# Patient Record
Sex: Male | Born: 1955
Health system: Southern US, Community
[De-identification: ages and names within clinical notes are randomized; demographics above are authoritative.]

## PROBLEM LIST (undated history)

## (undated) DIAGNOSIS — G8929 Other chronic pain: Secondary | ICD-10-CM

## (undated) DIAGNOSIS — J449 Chronic obstructive pulmonary disease, unspecified: Secondary | ICD-10-CM

## (undated) DIAGNOSIS — D72829 Elevated white blood cell count, unspecified: Secondary | ICD-10-CM

## (undated) DIAGNOSIS — I10 Essential (primary) hypertension: Secondary | ICD-10-CM

## (undated) HISTORY — PX: OTHER SURGICAL HISTORY: SHX169

## (undated) HISTORY — DX: Elevated white blood cell count, unspecified: D72.829

## (undated) HISTORY — DX: Chronic obstructive pulmonary disease, unspecified: J44.9

## (undated) HISTORY — DX: Essential (primary) hypertension: I10

## (undated) HISTORY — DX: Other chronic pain: G89.29

---

## 2000-10-30 ENCOUNTER — Encounter: Payer: Self-pay | Admitting: Emergency Medicine

## 2000-10-30 ENCOUNTER — Inpatient Hospital Stay (HOSPITAL_COMMUNITY): Admission: EM | Admit: 2000-10-30 | Discharge: 2000-11-10 | Payer: Self-pay | Admitting: Emergency Medicine

## 2000-11-04 ENCOUNTER — Encounter: Payer: Self-pay | Admitting: Internal Medicine

## 2000-11-05 ENCOUNTER — Encounter: Payer: Self-pay | Admitting: Family Medicine

## 2000-11-08 ENCOUNTER — Encounter: Payer: Self-pay | Admitting: General Surgery

## 2000-12-01 ENCOUNTER — Inpatient Hospital Stay (HOSPITAL_COMMUNITY): Admission: EM | Admit: 2000-12-01 | Discharge: 2000-12-04 | Payer: Self-pay | Admitting: Emergency Medicine

## 2000-12-01 ENCOUNTER — Encounter: Payer: Self-pay | Admitting: Emergency Medicine

## 2000-12-02 ENCOUNTER — Encounter: Payer: Self-pay | Admitting: Family Medicine

## 2001-01-09 ENCOUNTER — Ambulatory Visit (HOSPITAL_COMMUNITY): Admission: RE | Admit: 2001-01-09 | Discharge: 2001-01-09 | Payer: Self-pay | Admitting: General Surgery

## 2001-01-09 ENCOUNTER — Encounter: Payer: Self-pay | Admitting: General Surgery

## 2001-01-28 ENCOUNTER — Inpatient Hospital Stay (HOSPITAL_COMMUNITY): Admission: AD | Admit: 2001-01-28 | Discharge: 2001-02-04 | Payer: Self-pay | Admitting: General Surgery

## 2005-01-26 ENCOUNTER — Ambulatory Visit (HOSPITAL_COMMUNITY): Admission: RE | Admit: 2005-01-26 | Discharge: 2005-01-26 | Payer: Self-pay | Admitting: *Deleted

## 2005-08-27 ENCOUNTER — Ambulatory Visit (HOSPITAL_COMMUNITY): Admission: RE | Admit: 2005-08-27 | Discharge: 2005-08-27 | Payer: Self-pay | Admitting: Family Medicine

## 2005-10-15 ENCOUNTER — Ambulatory Visit (HOSPITAL_COMMUNITY): Admission: RE | Admit: 2005-10-15 | Discharge: 2005-10-15 | Payer: Self-pay | Admitting: Family Medicine

## 2006-04-01 ENCOUNTER — Inpatient Hospital Stay (HOSPITAL_COMMUNITY): Admission: EM | Admit: 2006-04-01 | Discharge: 2006-04-03 | Payer: Self-pay | Admitting: Emergency Medicine

## 2006-04-18 ENCOUNTER — Inpatient Hospital Stay (HOSPITAL_COMMUNITY): Admission: RE | Admit: 2006-04-18 | Discharge: 2006-04-20 | Payer: Self-pay | Admitting: Orthopedic Surgery

## 2007-08-15 ENCOUNTER — Ambulatory Visit: Admission: RE | Admit: 2007-08-15 | Discharge: 2007-08-15 | Payer: Self-pay | Admitting: Orthopedic Surgery

## 2007-08-15 ENCOUNTER — Encounter (INDEPENDENT_AMBULATORY_CARE_PROVIDER_SITE_OTHER): Payer: Self-pay | Admitting: Orthopedic Surgery

## 2008-07-14 IMAGING — CR DG ANKLE 2V *R*
2 series · 2 of 2 positions shown · non-contrast
Comparison: none

CLINICAL DATA: Tree fell on ankle; pain.
 RIGHT ANKLE ? 2 VIEWS ? 04/01/06:

[view not recorded (1 of 2)]
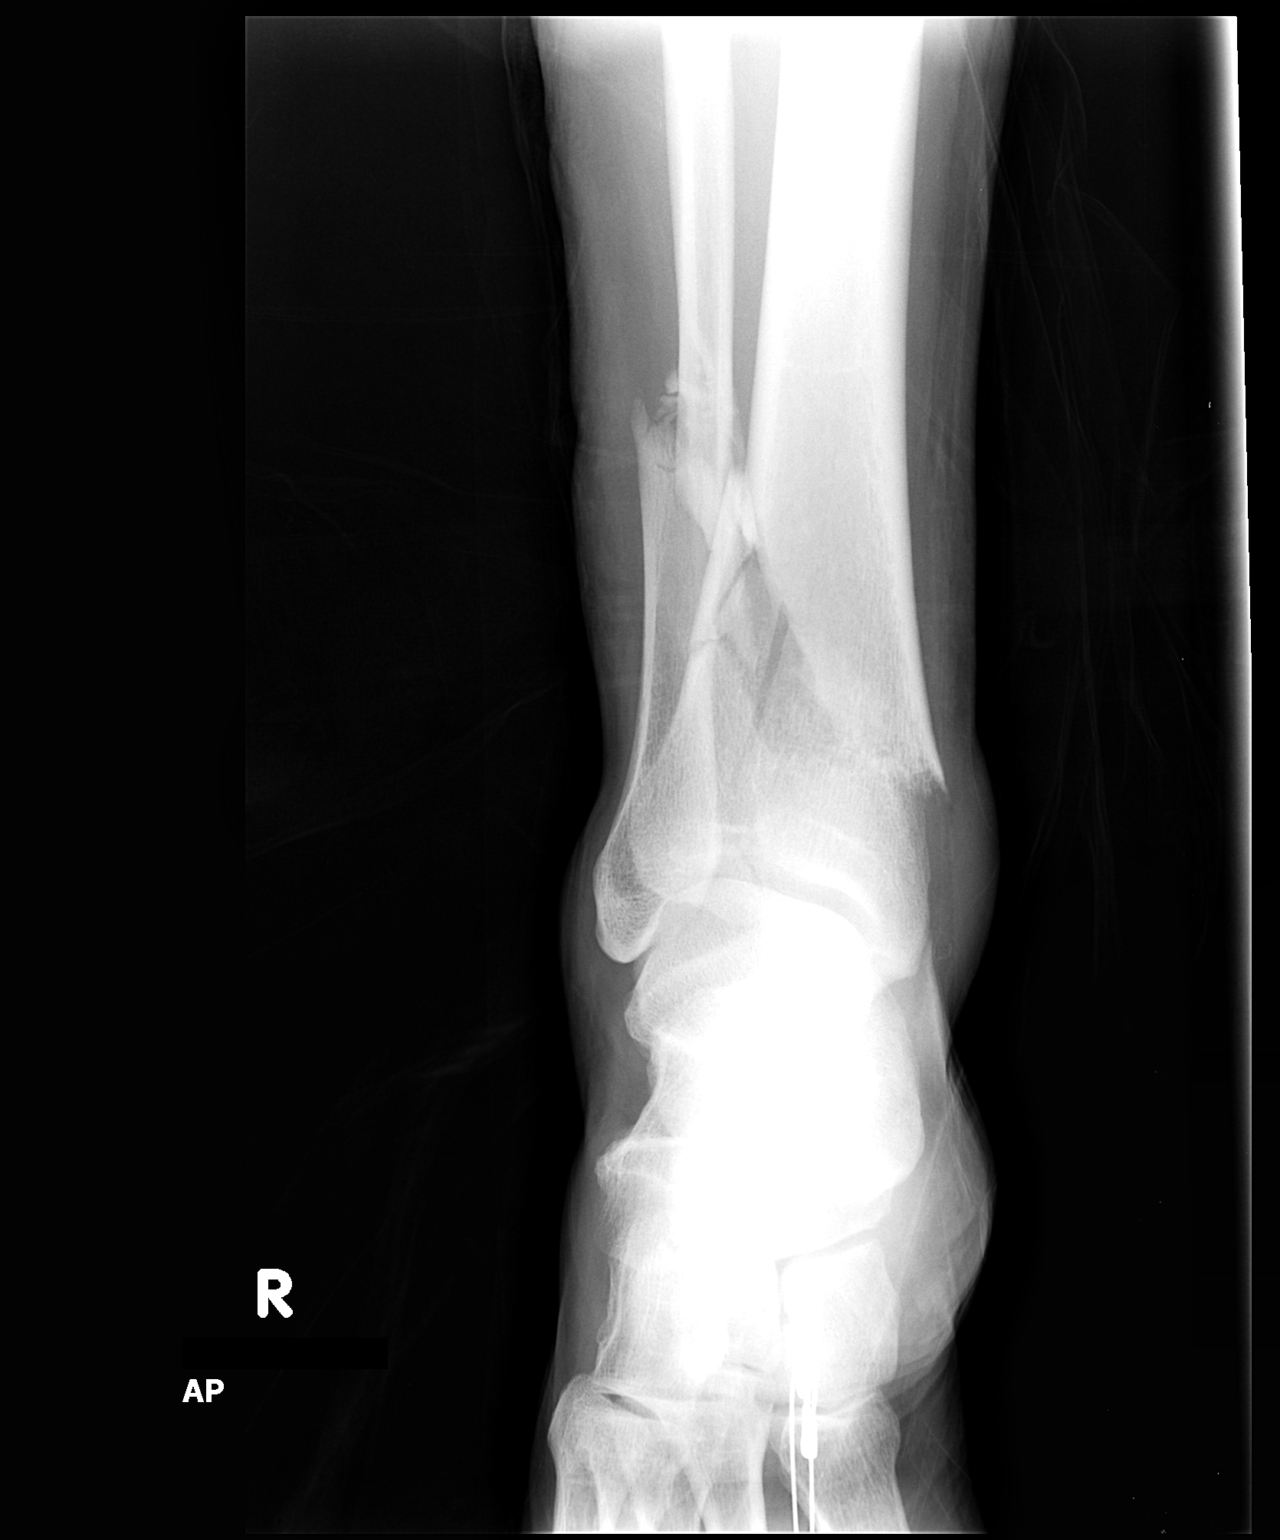

[view not recorded (2 of 2)]
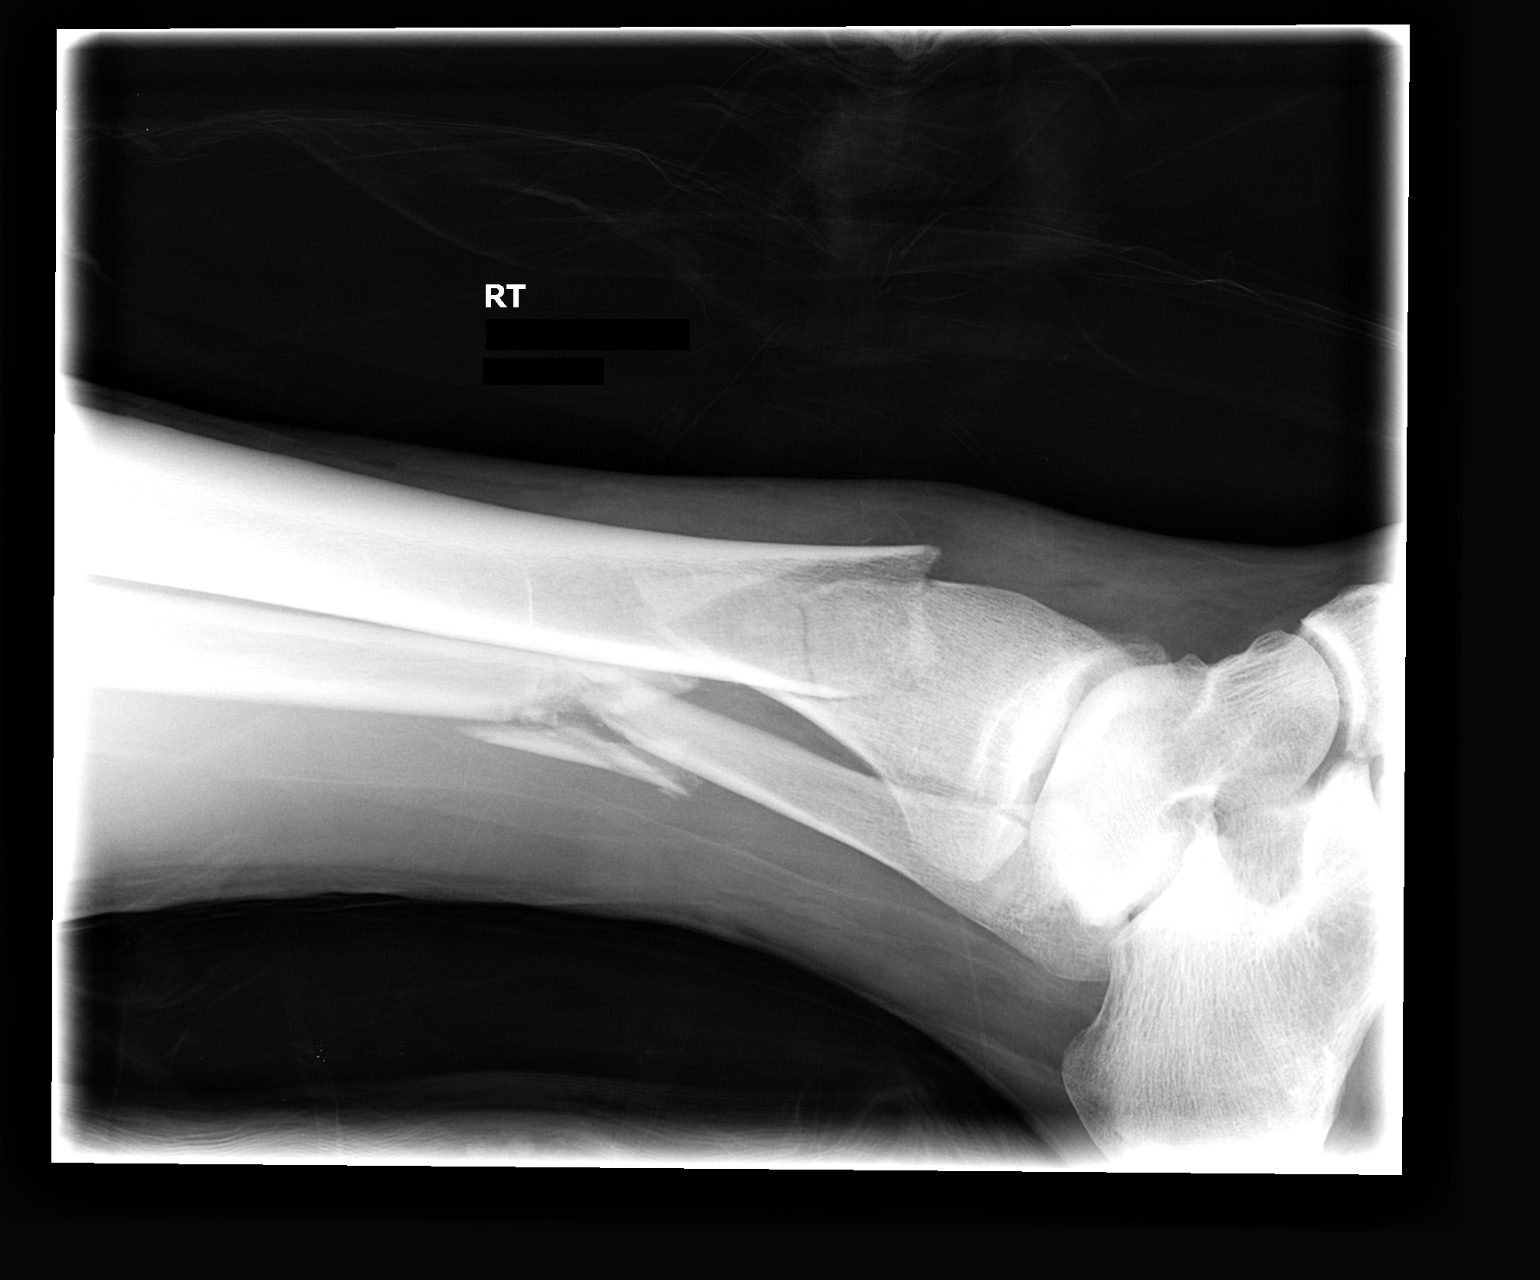

[2 of 2 positions shown; findings below may reference images not displayed]

FINDINGS: There are comminuted fractures of the distal tibia and fibula. The tibial fracture extends into the posterior ankle joint. The fibular fracture involves the shaft only.  The ankle mortise remains anatomically aligned. There is apex anterior angulation at both major fracture sites.
IMPRESSION: Comminuted fractures of the distal right fibular shaft and the distal right tibia which extends into the ankle joint. The ankle mortise remains anatomically aligned.

## 2008-07-29 ENCOUNTER — Ambulatory Visit (HOSPITAL_COMMUNITY): Admission: RE | Admit: 2008-07-29 | Discharge: 2008-07-29 | Payer: Self-pay | Admitting: Orthopedic Surgery

## 2008-09-03 ENCOUNTER — Ambulatory Visit (HOSPITAL_COMMUNITY): Admission: RE | Admit: 2008-09-03 | Discharge: 2008-09-05 | Payer: Self-pay | Admitting: Orthopedic Surgery

## 2008-11-25 ENCOUNTER — Encounter: Admission: RE | Admit: 2008-11-25 | Discharge: 2008-11-25 | Payer: Self-pay | Admitting: Orthopedic Surgery

## 2009-02-11 ENCOUNTER — Encounter: Admission: RE | Admit: 2009-02-11 | Discharge: 2009-03-22 | Payer: Self-pay | Admitting: Anesthesiology

## 2009-03-22 ENCOUNTER — Ambulatory Visit: Payer: Self-pay | Admitting: Anesthesiology

## 2009-07-21 ENCOUNTER — Ambulatory Visit (HOSPITAL_COMMUNITY): Admission: RE | Admit: 2009-07-21 | Discharge: 2009-07-21 | Payer: Self-pay | Admitting: Family Medicine

## 2009-08-29 ENCOUNTER — Encounter: Admission: RE | Admit: 2009-08-29 | Discharge: 2009-08-29 | Payer: Self-pay | Admitting: Anesthesiology

## 2009-10-05 ENCOUNTER — Ambulatory Visit (HOSPITAL_COMMUNITY): Admission: RE | Admit: 2009-10-05 | Discharge: 2009-10-05 | Payer: Self-pay | Admitting: Anesthesiology

## 2010-04-15 LAB — GRAM STAIN

## 2010-04-15 LAB — COMPREHENSIVE METABOLIC PANEL
BUN: 14 mg/dL (ref 6–23)
CO2: 32 mEq/L (ref 19–32)
Calcium: 8.9 mg/dL (ref 8.4–10.5)
Creatinine, Ser: 1.11 mg/dL (ref 0.4–1.5)
Potassium: 3.8 mEq/L (ref 3.5–5.1)
Sodium: 139 mEq/L (ref 135–145)
Total Protein: 6.1 g/dL (ref 6.0–8.3)

## 2010-04-15 LAB — DIFFERENTIAL
Basophils Absolute: 0 10*3/uL (ref 0.0–0.1)
Basophils Relative: 1 % (ref 0–1)
Eosinophils Relative: 3 % (ref 0–5)
Lymphocytes Relative: 28 % (ref 12–46)

## 2010-04-15 LAB — TISSUE CULTURE
Culture: NO GROWTH
Gram Stain: NONE SEEN

## 2010-04-15 LAB — CBC: Platelets: 231 10*3/uL (ref 150–400)

## 2010-04-15 LAB — C-REACTIVE PROTEIN: CRP: 0.4 mg/dL — ABNORMAL LOW (ref <0.6)

## 2010-04-15 LAB — ANAEROBIC CULTURE

## 2010-04-15 LAB — APTT: aPTT: 33 seconds (ref 24–37)

## 2010-04-15 LAB — SEDIMENTATION RATE: Sed Rate: 6 mm/hr (ref 0–16)

## 2010-04-15 LAB — WOUND CULTURE

## 2010-05-23 NOTE — Op Note (Signed)
NAME:  Brian Rich, Brian Rich              ACCOUNT NO.:  [id number]   MEDICAL RECORD NO.:  [id number]          PATIENT TYPE:  INP   LOCATION:  2550                         FACILITY:  [facility]   PHYSICIAN:  Doralee Albino. Carola Frost, M.D. DATE OF BIRTH:  March 10, 1955   DATE OF PROCEDURE:  08/15/2007  DATE OF DISCHARGE:  08/15/2007                               OPERATIVE REPORT   PREOPERATIVE DIAGNOSES:  1. Right ankle tibial nonunion.  2. Symptomatic hardware, right fibula.   POSTOPERATIVE DIAGNOSES:  1. Right ankle tibial nonunion.  2. Symptomatic hardware, right fibula.   PROCEDURES:  1. Repair of right tibia nonunion using a Vitoss structural allograft.  2. Removal of symptomatic hardware, right fibula.   SURGEON:  Doralee Albino. Carola Frost, MD   ASSISTANT:  Mearl Latin, PA   ANESTHESIA:  General supplemented with regional block.   COMPLICATIONS:  None.   SPECIMENS:  Three anaerobic, aerobic cultures and soft tissue fibrous  specimen which was greater than 3 x 3 cm within the distal metaphysis.   DRAINS:  None.   DISPOSITION:  PACU.   CONDITION:  Stable.   BRIEF SUMMARY AND INDICATION FOR PROCEDURE:  Brian Rich is a 55-year-  old male status post comminuted right distal tibia and fibula fracture.  The patient went on to return to work but he continued to complain of  aching distal tibia pain.  We obtained an MRI which clearly demonstrated  a sizable area of nonunion involving the anterolateral metaphysis  extending to the posterior cortex.  We discussed the risk and benefits  of treatment of this, as well as removal of the implant of the fibula  where he continued to have hypersensitivity and symptoms.  After full  discussion of those risks and benefits, he wished to proceed as did his  wife.   BRIEF DESCRIPTION OF PROCEDURE:  Brian Rich was administered preop  antibiotics, taken to the operating room where general anesthesia was  induced.  Right lower extremity was prepped and draped  in the usual  sterile fashion.  Tourniquet was put about the thigh but never inflated  during the procedure.  We made the old lateral incision, continued  through the surgical scar looking for the superficial peroneal nerve  which was not identified.  A fine plate elevating soft tissues carefully  and removing the screws and plate.  A rongeur was used to remove the  bony overgrowth debriding back to a smooth surface.  This was copiously  irrigated.   A 4-cm incision was then made distally.  Dissection was carried and the  periosteum layer was left intact initially.  We probed for the fracture  site with a 15 blade, identified it and then confirmed this  radiographically.  At that point, we split the periosteum over the  nonunion site.  We were surprised to find that it was an opening of only  8 x 4 mm.  We were concerned from the MRI that there were additional  openings laterally and consequently I did elevate the periosteum to look  both proximal and distal to this opening in the bone.  No other nonunion  extending to the lateral cortex or anterior cortex was identified.  We  then enlarged this opening slightly to approximately 10 x 10 mm and then  used a series of curettes to probe and debride the fibrous tissue within  the medullary canal.  It was a sizable cavity over 10 mL and did also  communicate with the intramedullary canal proximally as well.  We then  irrigated and as we did so, we did identify some fibrous debris  centrally.  We grabbed the tail of this and pulled it out delivering  greater than a 3 x 3 cm fibrous tissue mass.  We sent this down to  pathology for both culture and gross examination.  Similarly, we took  additional cultures with swabs for anaerobic and aerobic evaluation.  We  irrigated this area thoroughly and there was no purulence and no  evidence of infection at any time.  We then packed the defect with 10 mL  of Vitoss structural allograft focusing the  packing on the distal  metaphysis all the way back up to and including the opening in the  cortex.  We once more irrigated and closed in standard layered fashion  with 2-0 Vicryl and 3-0 nylon.  Simultaneous closure was performed of  the fibula with 2-0 Vicryl and 3-0 nylon greatly reducing his operative  time.  [name], PA assisted throughout the procedure with retraction  and again simultaneous wound closure.  At the conclusion of the  procedure, a sterile gently compressive dressing was applied then the  patient's off the shelf splint which he had from before.  He was  awakened from anesthesia and transferred to the PACU in stable  condition.   [name], PA, assisted throughout the procedure with retraction to  protect the neurovascular bundle and soft tissues.  He also helped to  close the wounds simultaneously, reducing operative time.   PROGNOSIS:  Brian Rich should do well following repair of this tibial  nonunion.  We are hopeful that his own bone will incorporate into the  structural allograft which would certainly be anticipated.  We will  follow up on his cultures but at this time, I have a very low suspicion  for infection.  He will be weightbearing up to tolerance as he should  have good structural integrity and will not require any formal DVT  prophylaxis.  Will continue to avoid NSAIDs and tobacco products and  will use his bone stimulator as an adjuvant for healing.      Doralee Albino. Carola Frost, M.D.  Electronically Signed     MHH/MEDQ  D:  08/15/2007  T:  08/15/2007  Job:  [id number]

## 2010-05-23 NOTE — Op Note (Signed)
NAME:  Brian, Rich              ACCOUNT NO.:  [id number]   MEDICAL RECORD NO.:  [id number]          PATIENT TYPE:  OIB   LOCATION:  5021                         FACILITY:  [facility]   PHYSICIAN:  Doralee Albino. Carola Frost, M.D. 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   POSTOPERATIVE DIAGNOSIS:  Right tibia cavitary nonunion/possible  infection.   PROCEDURES:  1. Intramedullary nailing of the right tibia.  2. Open treatment of tibial nonunion with grafting with autografting.  3. Reamed intramedullary aspiration of the right femur.   SURGEON:  Doralee Albino. Carola Frost, M.D.   ASSISTANT:  Mearl Latin, [place]   ANESTHESIA:  General.   COMPLICATIONS:  None.   ESTIMATED BLOOD LOSS:  250 mL of reaming.   DISPOSITION:  To PACU.   CONDITION:  Stable.   SPECIMENS:  1. Four anaerobic, aerobic cultures of intramedullary cyst nonunion      site to micro and path.  2. Anaerobic, aerobic cultures of old allograft right tibia      intramedullary cavity.   FINDINGS:  Negative for bacteria or significant PMN infiltrate.   BRIEF SUMMARY AND INDICATIONS FOR PROCEDURE:  Brian Rich is a very  pleasant 55 year old male who has been followed for several years for  difficulty recovering from a right distal tibia and fibular fracture.  MRI demonstrated recurrent large cavitary nonunion well over 50% of the  bone diameter.  The patient had clinical symptoms associated with  impending fracture.  We discussed the risks and benefits of surgery  including the possibility of infection, need for further surgery,  failure to obtain union, persistent infection, need for further surgery,  symptomatic hardware, and multiple others.  After full discussion, he  wished to proceed.   BRIEF SUMMARY OF PROCEDURE:  Brian Rich was administered preop  antibiotics.  Given the negative inflammatory markers and negative  clinical evidence of infection, as well as the sequestered material  within the canal which if infected we felt like would yield a positive  culture pathologic result.  We positioned him supine and again general  anesthesia was induced.  His right lower extremity was prepped and  draped usual sterile fashion.  I began with harvest of the reamed  intramedullary aspirate, making a 2-cm incision proximal of the greater  trochanter after identifying the correct starting point and trajectory  with C-arm.  The guidewire, then cannulated awl, then guidepin were  advanced in the distal femur and a 14-mm reamer was advanced collecting  large amount of graft.  This being successful, we then irrigated the  incision and closed in standard layered fashion with 0 Vicryl, 2-0  Vicryl, and 3-0 nylon.  Sterile gently compressive dressing was applied.   Attention was turned to the right tibia where an open incision was made  down at the nonunion or possible infection site.  This was carried down  into the periosteum.  Periosteum being split longitudinally with a 15  blade.  The  intramedullary contents then began to extrude under a slight  amount of pressure.  There were multiple fat globules and appeared to be  marrow contents again under pressure.  There was some associated old  allograft material and some of the viscous fluid was sent for path and  culture as was this graft material.  A considerable amount of time was  then used to aggressively curettage this lesion and to irrigate and  remove all the allograft material.  I then made a 2-cm incision at the  base of the patella.  The curved cannulated awl just medial to the  lateral tibial spine, advanced in a center position of the tibia, passed  a guidewire down in the center position of the plafond confirming on  orthogonal x-rays, sequentially reamed up to 11 mm and placed a 10 x  360-  mm nail.  Using perfect circle technique, two distal locks were placed  from medial to lateral and from anteromedial to posterolateral to reduce  bending stress.  One proximal lock was placed.  Perfect position was  confirmed with a C-arm imaging.  Wounds were irrigated and the locking  sites closed with 3-0 nylon.  The nonunion site prior to placement of  the nail, actually had some of the re-autograft placed into the cavity  and after passage of the nail more of this material was packed into the  cavity completely filling it.  The wound was dabbed dry.  The  retinaculum was reapproximated and then the skin closed with PDS and 3-0  nylon.  Sterile gently compressive dressing was applied and a posterior  splint.  The patient was awake from anesthesia and transported to PACU  in stable condition.   [name], [facility], was required as an Geophysicist/field seismologist for safe and effective  completion of the procedures, providing control of the femur during  intramedullary graft harvest, reaming and placement of the nail in the  tibia, and retraction for the open repair of the nonunion sight.   PROGNOSIS:  Brian Rich will be weightbearing as tolerated with PT, OT.  He will be on Lovenox for DVT prophylaxis while in the hospital with  probable transition to aspirin at discharge depending on his mobility.  We will see him back in the office in 10-14 days for recheck.      Doralee Albino. Carola Frost, M.D.  Electronically Signed     MHH/MEDQ  D:  09/03/2008  T:  09/04/2008  Job:  [id number]

## 2010-05-26 NOTE — Discharge Summary (Signed)
[facility]  Patient:    Brian Rich, Brian Rich Visit Number: [id number] MRN: [id number]          Service Type: MED Location: 3A A304 01 Attending Physician:  Patrica Duel Dictated by:   Patrica Duel, M.D. Admit Date:  12/01/2000 Discharge Date: 12/04/2000                             Discharge Summary  DISCHARGE DIAGNOSES: 1. Recurrent abscesses secondary to diverticulitis, symptoms resolved. 2. Status post percutaneous drainage of abscesses November 2. 3. Depression, improved. 4. Chronic anxiety.  HISTORY OF PRESENT ILLNESS:  For details regarding admission please refer to admitting note.  Briefly, this 55 year Brian Rich male presented to the hospital approximately five to six weeks ago with abdominal pain.  He was ultimately found to have diverticulitis with abscess formation.  These were drained percutaneously at [facility].  The drains were removed November 2 and he was discharged home November 3.  Since discharge the patient has failed to do very well.  He has continued to experience abdominal pain and ongoing anorexia.  He has lost approximately 30 pounds.  He has become increasingly depressed, tearful, and with insomnia.  His abdominal pain had waxed and waned.  One of his percutaneous drain site has continued to drain.  He is brought to the hospital for further evaluation.  In the emergency department he was found to have a mild leukocytosis, otherwise chemistries and hematologies were normal.  He was also found to have oral candidiasis.  He was admitted for further evaluation and therapy.  HOSPITAL COURSE:  Patient was admitted to 3A and administered empiric antibiotics.  Dr. Lovell Sheehan was consulted the following day.  A CT scan was obtained which showed some fluid collections in the pelvis consistent with abscesses.  Dr. Lovell Sheehan has aspirated one superficial region which was somewhat fluctuant and crepitant.  This has completely resolved.  His  symptoms have improved dramatically with forced drainage of the patent fistula from previous percutaneous drainage.  Currently, the patients abdomen is soft.  He has had no fevers.  Appetite has improved dramatically and Dr. Lovell Sheehan feels he is stable for discharge.  He will be followed closely as an outpatient. His oral candidiasis has cleared and his spirits are much improved.  DISCHARGE MEDICATIONS: 1. Cipro 500 mg p.o. q.d. 2. Nystatin swish and swallow q.i.d. 3. Diazepam 10 mg q.i.d. 4. Paxil 20 mg q.d. 5. Percocet one q.4-6h. p.r.n. pain.  He will be followed expectantly as an outpatient. Dictated by:   Patrica Duel, M.D. Attending Physician:  Patrica Duel DD:  12/04/00 TD:  12/04/00 Job: [id number]

## 2010-05-26 NOTE — H&P (Signed)
[facility]  Patient:    Brian Rich, Brian Rich Visit Number: [id number] MRN: [id number]          Service Type: OUT Location: RAD Attending Physician:  Dalia Heading Dictated by:   Franky Macho, M.D. Admit Date:  01/09/2001 Discharge Date: 01/09/2001   CC:         Dennie Maizes, M.D.  Karleen Hampshire, M.D.   History and Physical  AGE:  55 years old.  CHIEF COMPLAINT:  Pneumaturia, history of sigmoid diverticulitis.  HISTORY OF PRESENT ILLNESS:  The patient is a 55 year old white male who had an episode of sigmoid diverticulitis with intra-abdominal abscess formation in October 2002.  Approximately one month ago he began experiencing intermittent pneumaturia.  A barium enema did not show any colovesical fistula, although the patient continues to have pneumaturia.  He has been seen by Dr. Rito Ehrlich, who does feel that he does have some type of colovesical fistula and requires further surgical intervention.  PAST MEDICAL HISTORY:  Anxiety disorder.  PAST SURGICAL HISTORY: 1. Omphalocele surgery as a newborn. 2. Inguinal herniorrhaphy.  CURRENT MEDICATIONS:  Ciprofloxacin and Xanax.  ALLERGIES:  No known drug allergies.  REVIEW OF SYSTEMS:  Unremarkable.  The patient denies any bleeding disorders.  PHYSICAL EXAMINATION:  GENERAL:  Anxious 55 year old white male who is in no acute distress.  VITAL SIGNS:  Afebrile, vital signs stable.  LUNGS:  Clear to auscultation, with equal breath sounds bilaterally.  HEART:  Regular rate and rhythm without S3, S4, or murmurs.  ABDOMEN:  Soft, nontender, nondistended.  No hepatosplenomegaly or masses are noted.  No hernias are noted.  RECTAL:  Unremarkable.  IMPRESSION:  History of sigmoid diverticulitis with new-onset pneumaturia, probable colovesical fistula.  PLAN:  The patient will be admitted to the hospital for intravenous antibiotic therapy and bowel preparation.  In conjunction with Dr.  Rito Ehrlich, a cystoscopy, stent placement, partial colectomy, repair of colovesical fistula will be performed on January 29, 2001.  The risks and benefits of the procedures including bleeding, infection, recurrence of the fistula, cardiopulmonary difficulties, and the possibility of a blood transfusion were fully explained to the patient, who gives informed consent. Dictated by:   Franky Macho, M.D. Attending Physician:  Dalia Heading DD:  01/27/01 TD:  01/27/01 Job: [id number]

## 2010-05-26 NOTE — Op Note (Signed)
NAME:  Brian Rich, PERKIN              ACCOUNT NO.:  [id number]   MEDICAL RECORD NO.:  [id number]          PATIENT TYPE:  INP   LOCATION:  2550                         FACILITY:  [facility]   PHYSICIAN:  Doralee Albino. Carola Frost, M.D. DATE OF BIRTH:  25-Jun-1955   DATE OF PROCEDURE:  04/18/2006  DATE OF DISCHARGE:                               OPERATIVE REPORT   PREOPERATIVE DIAGNOSES:  1. Right tibia shaft fracture.  2. Right bimalleolar ankle fracture.   POSTOPERATIVE DIAGNOSES:  1. Right tibia shaft fracture.  2. Right bimalleolar ankle fracture.   PROCEDURES:  1. Removal of spanning external fixator.  2. ORIF of right tibia shaft.  3. ORIF of bimalleolar fracture.   SURGEON:  Doralee Albino. Carola Frost, M.D.   ASSISTANT:  None.   ANESTHESIA:  General.   COMPLICATIONS:  Disruption superficial peroneal nerve.   ESTIMATED BLOOD LOSS:  Less 200 mL.   TOURNIQUET:  None.   DISPOSITION:  To PACU.   CONDITION:  Stable.   BRIEF SUMMARY OF INDICATION FOR PROCEDURE:  Quitman Norberto. Caspers is a 55-  year-old male who sustained a right distal tibia and ankle fracture.  He  was initially seen and evaluated by Dr. Lajoyce Corners who placed into a  spanning external fixator.  He subsequently presented and at that time  noted to have some dysesthesias in his superficial peroneal nerve  distribution.  He had some patchy sensation.  He did have intact deep  peroneal and tibial nerve sensation.  He was beginning to drift into  equinus at the ankle joint, but his soft tissue swelling was adequate to  allow for surgery.  We discussed preoperatively the risks and benefits  of surgery including the possibility of infection, nerve injury  including development of RSD, vessel injury, malunion, nonunion,  arthritis, need for further surgery, DVT, PE and others.  After full  discussion, the patient wished to proceed.   DESCRIPTION OF PROCEDURE:  [name] was taken to the operating room  where general anesthesia was  induced.  His right lower extremity then  underwent removal of the spanning external fixator.  We then made a  lateral incision to approach the fibula.  We dissected carefully through  the soft tissues and identified the superficial peroneal nerve.  As we  traced it distally, it appeared to be partially torn, but did have some  continuity.  We continued the dissection distally and then used a 15  blade to expose the fracture proximally.  We then used the lobster claw  in an attempt to gain length through distraction.  We were capable of  obtaining length only in a limited fashion.  The patient had markedly  shortened, over 1.5 cm.  We then determine that the femoral distractor  would be necessary.  This was applied through the tibia on the lateral  surface and then through the calcaneus pin placed posterior to the last  one and just penetrated the far cortex.  We then began to apply  distraction sequentially.  This did require a reasonable time period and  we allowed the soft tissues to relax  during this distraction phase.  We  did make a small medial incision for introduction of what we anticipated  to be a percutaneous plate.  We placed a small Cobb into the fracture  site medially and attempted to manipulate this and allow for distraction  in case it is being tethered on that side.  It did not appear to be  significantly affected by this.  We then once more applied lobster claw  and were incapable of still obtaining full length.  We then placed a  screw in the fibula proximal to the anticipated position of the plate.  We affixed the plate to the distal fragment and then used the screw to  push against proximally in order to re-obtain our appropriate length.  We then held this plate apposed to the proximal fragment with a lobster  claw and obtained some fixation proximally.  We placed the remaining two  screws during exposure and instrumentation distally, it was noted that  the tenuous  remaining intact fibers of the superficial peroneal nerve  had disrupted.  The ends of the nerve were then buried in the muscle of  the peroneals laterally and proximally and the extensors distally and  anteriorly.  We then used the medial anatomic nonlocking plate preparing  a space for the plate with the Cobb and sliding it percutaneously up the  shaft.  We did not expose the fracture site directly.  We did place a  small Cobb into the fracture site and to lever the distal fragment  medially and prevent the translation.  We were very careful watch for  alignment and make sure that we did not drift into varus as we tried to  correct the valgus deformity associated with his injury.  We placed  initially a lag screw and then distal and proximal fixation, followed by  removal of the fixator and completion of our fixation construct.  This  resulted in balanced stable fixation of the shaft and the lateral  malleolus.  Lastly, we addressed the anterolateral and posterior  malleolus fractures by placement of a anterior to posterior lag screw  and through the Tillaux fragment obtaining excellent purchase and  compression.  This was placed using lag technique.  The wounds were  copiously irrigated and closed in a standard layered fashion using 0  Vicryl, 2-0 Vicryl and 3-0 nylon.  Sterile gently compressive dressing  and posterior and stirrup splint were then applied.  The patient was  awakened and taken to PACU in stable condition.   PROGNOSIS:  Mr. Branton should do well if he can maintain his  restrictions, given the alignment we were able to obtain in the joint  congruity.  He will be non-weightbearing for the next 8 weeks with  graduated weightbearing thereafter.  He will be on Lovenox for the next  20 days for DVT prophylaxis after discharge from the hospital.  Elevate  and then begin early motion in around 2-4 weeks.      Doralee Albino. Carola Frost, M.D. Electronically Signed     MHH/MEDQ   D:  04/18/2006  T:  04/18/2006  Job:  [id number]

## 2010-05-26 NOTE — H&P (Signed)
[facility]  Patient:    Brian Rich, Brian Rich Visit Number: [id number] MRN: [id number]          Service Type: MED Location: [phone] Attending Physician:  Annamarie Dawley Dictated by:   Karleen Hampshire, M.D. Admit Date:  10/30/2000                           History and Physical  CHIEF COMPLAINT: Abdominal pain.  HISTORY OF PRESENT ILLNESS: This is a 55 year old white male, who originally presented to the office a week before admission with some nausea, vomiting, and diarrhea, felt to be a gastroenteritis since his wife had the same symptoms.  The patient was treated for urinary tract with Cipro at that time. The patient returned to the office having increased pain and was very tender in his suprapubic area.  Emergency room work-up revealed a CT scan of acute and chronic diverticulitis, with several associated abscesses, the largest measuring 4.6 x 1.7 cm, significant inflammatory changes.  The patient had a WBC of 14,000.  Urinalysis was negative, electrolytes normal.  MEDICATIONS:  1. Xanax p.r.n. and/or diazepam.  2. Prevacid for gastritis.  PAST SURGICAL HISTORY: The patient had some sort of abdominal surgery as a baby and herniorrhaphy approximately 25 years ago.  REVIEW OF SYSTEMS: Denies nausea or vomiting.  Has had some mild diarrhea.  PHYSICAL EXAMINATION:  GENERAL: Middle-aged white male, who appears miserable due to pain.  VITAL SIGNS: TEMP 99 degrees, pulse 75 and regular, respirations 20 and unlabored, blood pressure 130/80.  HEENT: TMs normal.  PERRLA.  Oropharynx benign.  NECK: Supple without JVD, bruits, thyromegaly.  LUNGS: Essentially clear in all areas.  HEART: Regular sinus rhythm without murmurs, rubs, or gallops.  ABDOMEN: Decreased bowel sounds, tenderness in the lower abdomen with mild rebound.  EXTREMITIES: Without clubbing, cyanosis, or edema.  NEUROLOGIC: Grossly intact.  ASSESSMENT:  1. Acute  diverticulitis with abscess.  2. History of herniorrhaphy. Dictated by:   Karleen Hampshire, M.D. Attending Physician:  Annamarie Dawley DD:  10/30/00 TD:  10/31/00 Job: [id number]

## 2010-05-26 NOTE — Discharge Summary (Signed)
[facility]  Patient:    Brian Rich, Brian Rich Visit Number: [id number] MRN: [id number]          Service Type: MED Location: 3A [phone] Attending Physician:  Kirk Ruths Dictated by:   Karleen Hampshire, M.D. Admit Date:  10/30/2000 Discharge Date: 11/10/2000                             Discharge Summary  DISCHARGE DIAGNOSES: 1. Diverticulitis with abscess formation. 2. Urinary retention.  HOSPITAL COURSE:  This otherwise healthy 55 year old white male was admitted with nausea, vomiting, diarrhea.  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  By the time I saw him in the office he had significant tenderness in his lower abdominal area.  In the emergency room CT showed chronic diverticulitis with two abscess formations, but no evidence of free air.  Patients initial white count was 14,000.  He was admitted to the floor, begun on Cipro/Flagyl, and was seen in consultation by Dr. Lovell Sheehan for surgery.  Patient was treated with IV antibiotics.  Continued to have pain and tenderness as well as fever. Repeat CT scan showed increased size of the abscesses at which time it was elected to have patients CT guided drainage of the abscesses at [facility] per radiology.  Meanwhile, patient had some urinary retention.  Seen by Dr. ______ of urology.  Felt it was related to his abscesses.  Foley was placed. No further urology work-up was required.  Patients urinalysis was normal. Patient, after insertion of drains, had 40 cc of reddish pus drained.  Drains were left in.  Patient defervesced after placement of the drains.  Much less tenderness.  Fever defervesced.  Patient was feeling better.  The drains were removed.  Patient continued to be afebrile without significant tenderness and was discharged home on Flagyl,  Cipro, Tylox and will follow up with Dr. Lovell Sheehan in two weeks in the office. Dictated by:   Karleen Hampshire, M.D. Attending Physician:  Kirk Ruths DD:  11/27/00 TD:  11/27/00 Job: [id number]

## 2010-05-26 NOTE — Op Note (Signed)
Brian Rich, Brian Rich              ACCOUNT NO.:  [id number]   MEDICAL RECORD NO.:  [id number]          PATIENT TYPE:  INP   LOCATION:  5021                         FACILITY:  [facility]   PHYSICIAN:  Madlyn Frankel. Charlann Boxer, M.D.  DATE OF BIRTH:  05/22/1955   DATE OF PROCEDURE:  04/01/2006  DATE OF DISCHARGE:                               OPERATIVE REPORT   PREOPERATIVE DIAGNOSIS:  Closed right distal tibia fracture, comminuted,  with intra-articular extension.   POSTOPERATIVE DIAGNOSIS:  Closed right distal tibia fracture,  comminuted, with intra-articular extension.   FINDINGS:  The patient was noted have significant swelling with fracture  blister and bruising medially, but no open wound.   PROCEDURE:  1. Closed reduction and application of external fixator to the right      lower leg.  2. Stress radiography.   SURGEON:  Madlyn Frankel. Charlann Boxer, MD   ASSISTANT:  Dwyane Luo, PA-C   ANESTHESIA:  General.   BLOOD LOSS:  None.   COMPLICATIONS:  None.   INDICATIONS FOR PROCEDURE:  Mr. Goar is a 55 year old gentleman who  was at work today when he was cutting a tree down and he was unable to  get away from a falling tree and the tree landed on him.  He had  immediate onset of pain and deformity and inability to bear weight.  He  was brought to the ER.  He was accompanied by a worker's Warden/ranger.   Review of his leg revealed significant swelling with blistering and  seeping as if he had a fracture blister along the medial side of his  lower leg.  He had palpable pulses and intact sensibility.  Given the  comminution of his fracture as well as intra-articular extension and  most importantly a soft tissue envelope, it was felt that the best mode  of fixation at this point was external fixator to provide soft tissue  relaxation and to allow for studies and for referral for definitive  treatment.  This was reviewed with the patient and his wife and the case  Production designer, theatre/television/film.  The  risks of this type of procedure and risks of open  procedure with infection due to his blistering were all reviewed and  consent obtained.   PROCEDURE IN DETAIL:  The patient was brought to the operative theater.  Once adequate anesthesia and preoperative antibiotics of gram of Ancef  and 80 mg of gentamicin were administered, the patient was positioned  supine.  The right leg from toes to the knee was prepped and draped in a  sterile fashion.   A transcalcaneal pin was then placed.  Two proximal leg pins were then  placed into the tibia.  With this in place, a series of bar-to-bar and  pin-to-bar connections were made.  Under fluoroscopic imaging in the AP  and lateral planes, the fracture was reduced, importantly keeping the  ankle in a symmetric position in AP and lateral planes.  It was  virtually impossible to maintain an adequate and anatomic reduction,  though it was more in a stable condition.  Once I was satisfied with  the  position of the ankle, everything was tightened down on the external  fixator and final radiographs obtained in the AP and lateral planes.   The stab wounds were then wrapped in Xeroform and gauze.  The area  medially was blistering and oozing and covered with Xeroform and gauze.  The entire frame was then wrapped in cast padding and a 5 x 30 splint  placed on the posterior aspect of the foot to keep it in the held  position of neutral.   Ace wrap was then placed over top of this.  The patient was then  extubated and brought to the recovery room in stable condition.  Plan  will be to order a CT scan overnight, have the patient's studies  evaluated by Dr. Leonides Grills, foot and ankle specialist, for definitive  management.      Madlyn Frankel Charlann Boxer, M.D.  Electronically Signed     MDO/MEDQ  D:  04/01/2006  T:  04/02/2006  Job:  [id number]

## 2010-05-26 NOTE — Discharge Summary (Signed)
Brian Rich, Brian Rich              ACCOUNT NO.:  [id number]   MEDICAL RECORD NO.:  [id number]          PATIENT TYPE:  INP   LOCATION:  5021                         FACILITY:  [facility]   PHYSICIAN:  Madlyn Frankel. Charlann Boxer, M.D.  DATE OF BIRTH:  [birth year]   DATE OF ADMISSION:  04/01/2006  DATE OF DISCHARGE:  04/03/2006                               DISCHARGE SUMMARY   REASON FOR ADMISSION:  1. Right ankle pain with right ankle fracture.  2. Hypertension.  3. Hyperlipidemia.   DISCHARGE DIAGNOSIS:  1. Right ankle fracture status post open reduction and internal      fixation, right ankle, external fixator.  2. Hypertension.  3. Hyperlipidemia.   CONSULTS:  None.   SURGERIES:  Madlyn Frankel. Charlann Boxer, M.D.   ASSISTANT:  Dwyane Luo, [facility].   PROCEDURE:  Open reduction and internal fixation of right ankle with  external fixator.   HISTORY OF PRESENT ILLNESS:  This is a 55 year old male who works for  the [employer], community.  He had a tree fall onto him.  He was  standing on the side of the __________  and it fell on his shoulder and  across his right lower leg.  He had immediate pain and warmth. He was  unable to bear any weight, and his ankles seemed to be twisted around  backwards.  We had to re-orient it and did so at the scene.  He has had  no previous history of injury to the left and right lower extremity.  He  was evaluated in the emergency department.  Consent was obtained for  reduction of the fracture.  Reported no other injuries to any other body  parts at the time.   LABORATORY:  On admission, CBC:  Hematocrit 39.2 and stable.  Differential:  Normal limits.  Coagulation within normal limits.  Chemistries:  Glucose 101, all others normal.  GI:  Total protein of  5.3, albumin 3.1.   Radiology:  Ankle 2 view revealed comminuted fractures of the distal  right tibia and fibula posterior angulation.  CT of the ankle:  Comminuted distal tibiofibular fractures.  Chest 2 view:  No  acute  abnormality.  Fluoroscopy used intraoperatively.   EKG ordered and performed in the emergency department:  No acute  changes.   HOSPITAL COURSE:  The patient was evaluated in the emergency department  and was admitted to our service.  Subsequently he was cleared and  external fixator was placed on his right distal tibiofibular fracture.  Postoperative day #1, he was doing fine.  He was afebrile.  He had brisk  cap refill to his right lower extremity, can wiggle his toes.  Pain was  well controlled.  He was nonweightbearing.  Plan was for discharge the  next day with followup in 7 to 10 days with Dr. Myrene Galas.  On the  26th, he was awake, alert, and oriented.  He was doing well, afebrile.  Again had brisk cap refill, right lower extremity.  Toes move freely.   PLAN:  Discharge home, nonweightbearing right lower extremity.  To  follow up with Dr.  Handy and pain medicines given at that time.   DISCHARGE DISPOSITION:  Stable and improved.   Discharge to physical therapy, nonweightbearing, right lower extremity.  Casts and immobilization.   WOUND CARE:  Keep cast dry.   DISCHARGE MEDICATIONS:  1. Avalide 150/12.5 1 p.o. daily.  2. Effexor XR 37.5 mg p.o. daily.  3. Nexium 40 mg p.o. daily.  4. Zocor 10 mg p.o. daily.  5. Xanax 1 mg p.o. every 8 p.r.n.  6. Ambien 10 mg p.o. q.h.s.  7. Enteric-coated aspirin 325 mg p.o. daily.  8. Vicodin 5/325 one to two p.o. every 4 to 6 p.r.n. pain.  9. Robaxin 500 mg one p.o. every 6 p.r.n. muscle spasms.  10.Colace 100 mg 1 p.o. b.i.d. p.r.n. constipation.   DISCHARGE INSTRUCTIONS:  1. He is to follow up with Dr. Carola Frost, [phone], in 7-10 days.  2. If patient develops acute shortness of breath or calf pain, to call      my service immediately.   DISCHARGE DIET:  Regular as tolerated by the patient.     ______________________________  Yetta Glassman. [address], [place]      Madlyn Frankel. Charlann Boxer, M.D.  Electronically Signed    BLM/MEDQ   D:  05/27/2006  T:  05/27/2006  Job:  [id number]

## 2010-05-26 NOTE — H&P (Signed)
[facility]  Patient:    IMRI, LOR Visit Number: [id number] MRN: [id number]          Service Type: MED Location: 3A A304 [phone] Attending Physician:  Annamarie Dawley Dictated by:   Patrica Duel, M.D. Admit Date:  12/01/2000                           History and Physical  CHIEF COMPLAINT: Weight loss, abdominal pain, depression.  HISTORY OF PRESENT ILLNESS: This is a 55 year old male, with a remote history of depression and anxiety.  The patient experienced an episode of diverticulitis approximately five to six weeks ago.  He failed to respond to medical therapy and developed several abscesses.  These were drained at [facility] via percutaneous route.  The drains were removed on November 09, 2000 and he was discharged home on November 10, 2000.  Since discharge the patient has failed to do very well.  He has continued to experience abdominal pain and ongoing anorexia.  He notes an approximate 30 pound weight loss.  He has become increasingly depressed, tearful, with insomnia.  His abdominal pain has waxed and waned.  He has continued to have drainage from one of the percutaneous drainage sites.  He denies significant vomiting, diarrhea, or constipation, fever or chills, headache, neurologic deficits, chest pain, shortness of breath.  The patient is admitted for full evaluation of ongoing abdominal symptoms status post percutaneous drainage as noted above.  He is also to be treated for depression exacerbated by serious illness.  CURRENT MEDICATIONS:  1. OxyContin 20 mg b.i.d.  2. Tylox one q.4h to q.6h p.r.n. pain.  3. Valium 10 mg q.8h p.r.n. anxiety.  4. Ambien 10 mg q.h.s. p.r.n.  5. Nexium 40 mg q.d.  6. Tylenol p.r.n.  7. He finished a course of Cipro and Flagyl approximately seven days ago.  ALLERGIES: None known.  PAST MEDICAL HISTORY: As outlined above.  SOCIAL HISTORY: Nonsmoker for six months.  Denies use of  alcohol.  FAMILY HISTORY: Noncontributory.  REVIEW OF SYSTEMS: Negative except as mentioned.  PHYSICAL EXAMINATION:  GENERAL: Very pleasant male, who is tearful and outwardly depressed.  VITAL SIGNS: TEMP 98 degrees, pulse 100, respirations 20, blood pressure 102/77.  HEENT: Normocephalic, atraumatic.  PERRL.  Mucous membranes moist.  Sclerae clear.  Ears, nose, and throat benign.  NECK: Supple without bruits, thyromegaly, or lymphadenopathy.  LUNGS: Clear to A&P.  CARDIAC: Heart sounds normal without murmurs, rubs, or gallops.  ABDOMEN: Soft.  There is moderate periumbilical tenderness and an area of crepitus just superior to the umbilicus.  The percutaneous drainage site just to the right of the umbilicus is draining purulent discharge.  Bowel sounds normal.  No rebound or guarding.  EXTREMITIES: No clubbing, cyanosis, or edema.  NEUROLOGIC: Within normal limits.  LABORATORY DATA: WBC 16,000 with 82% neutrophils, 0% bands; H&H 12.5 and 36.6. Chem 12 is normal except for albumin of 2.7.  ASSESSMENT: Failure to thrive status post episode of diverticulosis as documented above.  Question recurrence crepitus in anterior abdominal wall that is bothersome.  Consider recurrent infection or subcutaneous infection. He is also obviously depressed.  PLAN: Surgical consult.  To begin Paxil 20 mg q.d.  Will follow and treat expectantly. Dictated by:   Patrica Duel, M.D. Attending Physician:  Annamarie Dawley DD:  12/02/00 TD:  12/02/00 Job: [id number]

## 2010-05-26 NOTE — Op Note (Signed)
[facility]  Patient:    Brian Rich, Brian Rich Visit Number: [id number] MRN: [id number]          Service Type: MED Location: 3A A305 01 Attending Physician:  Dalia Heading Dictated by:   Dennie Maizes, M.D. Proc. Date: 01/29/01 Admit Date:  01/28/2001               Franky Macho, M.D.             Patrica Duel, M.D.                           Operative Report  PREOPERATIVE DIAGNOSES: 1. Colovesical fistula. 2. Recurrent urinary tract infections. 3. Sigmoid colon diverticulitis.  POSTOPERATIVE DIAGNOSES: 1. Colovesical fistula. 2. Recurrent urinary tract infections. 3. Sigmoid colon diverticulitis.  OPERATION: 1. Cystoscopy. 2. Bilateral ureteral catheter insertion. 3. Repair of colovesical fistula.  SURGEON:  Dennie Maizes, M.D.  ASSISTANT:  Franky Macho, M.D.  ANESTHESIA:  General.  ESTIMATED BLOOD LOSS:  Minimal  COMPLICATIONS:  None.  DRAINS:  16-French Foley catheter in the bladder, 5-French ureteral catheters x 2.  INDICATIONS:  This 55 year old male has a history of recurrent urinary tract infections, associated colovesical fistula caused by chronic diverticulitis. The patient was scheduled to undergo colon resection by Dr. Lovell Sheehan.  I planned for cystoscopy, place bilateral ureteral catheters, and repair of the colovesical fistula.  DESCRIPTION OF PROCEDURE:   General anesthesia was inducted, and the patient was placed on the cystoscopy table in the dorsolithotomy position.  The lower abdomen and genitalia were prepped and draped in a sterile fashion. Cystoscopy was done with a 25-French scope.  The ureter and prostate were normal.  The bladder was then examined.  The trigone and ureteral orifices were normal.  There was white flaky material inside the bladder.  There was erythema and edema of the dome of the bladder, and the fistula could be seen at the dome.  A 5-French whistle tip catheter was then inserted on the right  side up to the level of 20 cm.  A second catheter was placed on the left side.  The cystoscope was then removed.  A 16-French corde catheter was then inserted into the bladder and affixed to the ureteral catheters with silk tie and tape.  Dr. Lovell Sheehan then proceeded with colon resection.  Through a lower abdominal incision, the peritoneal cavity was entered.  The sigmoid colon was found to be adherent to the dome of the bladder.  With sharp and blunt dissection, he released the colon for the dome of the bladder.  He did a colon resection.  The colon was separated for the bladder surface, and no entry was made into the bladder.  The edges of the defective bladder wall was then closed in two layers using 3-0 Vicryl.  A pedicle of omentum was then placed over the suture line, and the suture line was well covered.  Dr. Lovell Sheehan then did free anastomosis of the colon, and incision was closed.  The patient was transferred to the PACU in a satisfactory condition.  Sponge and instrument counts were correct x 2 at the time of closure.  The estimated blood loss was about 200 cc for the whole procedure. Dictated by:   Dennie Maizes, M.D. Attending Physician:  Dalia Heading DD:  01/29/01 TD:  01/30/01 Job: [id number]

## 2010-05-26 NOTE — Discharge Summary (Signed)
NAME:  Brian Rich, Brian Rich NO.:  [id number]   MEDICAL RECORD NO.:  [id number]          PATIENT TYPE:  INP   LOCATION:  5039                         FACILITY:  [facility]   PHYSICIAN:  Doralee Albino. Carola Frost, M.D. DATE OF BIRTH:  [birth year]   DATE OF ADMISSION:  04/18/2006  DATE OF DISCHARGE:  04/20/2006                               DISCHARGE SUMMARY   DISCHARGE DIAGNOSES:  1. Right tibia shaft fracture.  2. Right bimalleolar ankle fracture.   PROCEDURES PERFORMED:  On April 18, 2006:   1. ORIF of right tibial shaft.  2. ORIF of bimalleolar ankle fracture.  3. Removal of spanning external fixator.   HOSPITAL COURSE:  Mr. Fiumara underwent the procedures described above  without complication.  Postoperatively he was placed on Lovenox for DVT  prophylaxis.  He mobilized nonweightbearing on the operative extremity  with physical therapy.  He was able to be converted to oral pain  medications and, by postoperative day #2, was deemed stable for  discharge to home.  At that time his wound was noted to be clean, dry  and intact with no evidence of infection.   DISCHARGE INSTRUCTIONS:  The patient is to continue nonweightbearing.  He will take Lovenox daily until this is complete.  He will follow up  with Dr. Carola Frost in 3-5 days for further evaluation.  He will not require  any dressing changes until he returns to the office.      Doralee Albino. Carola Frost, M.D.  Electronically Signed     MHH/MEDQ  D:  06/19/2006  T:  06/19/2006  Job:  [id number]

## 2010-05-26 NOTE — Op Note (Signed)
[facility]  Patient:    Brian Rich, Brian Rich Visit Number: [id number] MRN: [id number]          Service Type: MED Location: 3A A305 01 Attending Physician:  Dalia Heading Dictated by:   Franky Macho, M.D. Proc. Date: 01/29/01 Admit Date:  01/28/2001   CC:         Dennie Maizes, M.D.  Patrica Duel, M.D.   Operative Report  PATIENT AGE: 55 years old.  PREOPERATIVE DIAGNOSES: 1. Colovesical fistula. 2. Diverticulitis.  POSTOPERATIVE DIAGNOSES: 1. Colovesical fistula. 2. Diverticulitis.  OPERATION: 1. Partial colectomy with low pelvic anastomosis. 2. Cystoscopy with ureteral stent placement. 3. Bladder repair.  SURGEONS:  Franky Macho, M.D. and Dennie Maizes, M.D.  ANESTHESIA:  General endotracheal anesthesia.  INDICATIONS:  The patient is a 55 year old white male with a history of sigmoid diverticulitis several months ago who was treated conservatively with medications, antibiotics, and CT-guided drainage of two intra-abdominal abscesses and recently presented with pneumaturia.  A barium enema was performed which revealed sigmoid diverticulosis but no evidence of a free perforation or colovesical fistula.  It is presumed that, due to his symptoms, he does have a colovesical fistula.  The patient now comes to the operating room for a partial colectomy as well as repair of colovesical fistula in conjunction with Dr. Rito Ehrlich of urology.  The risks and benefits of the procedures including bleeding, infection, cardiopulmonary difficulties, the possibility of a colostomy were fully explained to the patient who gave informed consent.  DESCRIPTION OF PROCEDURE:  The patient was placed in the lithotomy position. Dr. Rito Ehrlich of urology performed a cystoscopy and stent placement.  He will dictate that portion of the procedure.  The abdomen and perineum were prepped and draped using the usual sterile technique with Betadine.  A midline  incision was made down to the fascia.  The peritoneal cavity was entered into without difficulty.  No purulent fluid was noted within the abdominal cavity.  On inspection of the pelvis, the distal portion of the sigmoid colon was noted to be adhered to the dome of the bladder.  This was freed away bluntly and sharply without difficulty.  The diverticulosis was noted to be localized to the sigmoid colon.  A GIA stapler was placed across the distal sigmoid colon and across the proximal sigmoid colon and fired.  The mesentery was divided using an LDS stapler.  That portion of the sigmoid colon was then removed from the operative field.  Dr. Rito Ehrlich then proceeded to perform a bladder repair of the fistula.  He will dictate that portion of the procedure.  After Dr. Rito Ehrlich had finished his portion of the procedure, a side-to-side colorectal anastomosis was then performed using a GIA stapler.  The colotomy was closed using a TA60 staple.  The staple line was bolstered using 3-0 silk limbered sutures.  Omentum was then placed down over the anastomosis and secured in place using a 3-0 silk suture.  The abdominal cavity was copiously irrigated with gentamicin and normal saline.  The nasogastric tube was noted to be in the appropriate position within the stomach.  No other abnormalities were found.  The appendix was noted to be normal.  The rest of the colon was within normal limits.  The bowel was returned into the abdominal cavity in an orderly fashion.  All surgical personnel then changed their gloves.  The fascia was reapproximated using a loop of Novofil running suture.  The subcutaneous layer was irrigated with normal saline, and  the skin was closed using staples. Betadine ointment and dry sterile dressings were applied.  All tape and needle counts were correct at the end of the procedure.  The patient was extubated in the operating room and went back to the recovery room awake and in  stable condition.  COMPLICATIONS:  None.  SPECIMEN:  Sigmoid colon.  ESTIMATED BLOOD LOSS:  150 cc. Dictated by:   Franky Macho, M.D. Attending Physician:  Dalia Heading DD:  01/29/01 TD:  01/30/01 Job: [id number]

## 2010-05-26 NOTE — Consult Note (Signed)
[facility]  Patient:    Brian Rich, Brian Rich Visit Number: [id number] MRN: [id number]          Service Type: MED Location: 3A [id number] Attending Physician:  Kirk Ruths Dictated by:   Alleen Borne, M.D. Proc. Date: 11/01/00 Admit Date:  10/30/2000 Discharge Date: 11/10/2000                            Consultation Report  CHIEF COMPLAINT:  Pain in suprapubic area.  HISTORY:  This 55 year old gentleman came here the day before yesterday with nausea, vomiting, diarrhea, suprapubic discomfort treated initially as outpatient for gastroenteritis but symptoms got worse with increasing pain and tenderness in the suprapubic area.  CT scan in the emergency room showed chronic diverticulitis with associated abscesses in the pelvis.  Patient was admitted for further workup and management.  He says today he is feeling better, but still running low grade temperature.  I was called in to see him because he started to have difficulty voiding, he says.  He had suprapubic discomfort, especially after he finished urinating.  He has slow and weak stream, dysuria, urinary streams starts and stops.  He denies having any urological problems in the past.  No history of gross hematuria.  He is not having any nausea or vomiting.  He is tolerating Jello well.  Temperature has come down.  The pain is much less.  PAST MEDICAL HISTORY:  Essentially negative.  Some kind of abdominal surgery as a child.  PHYSICAL EXAMINATION  GENERAL:  Thinly-built male who appears sick but he is not in any acute distress, fully conscious, alert, oriented.  VITAL SIGNS:  Temperature 98.  ABDOMEN:  Soft.  Deep tenderness in the suprapubic area.  Bladder appears to be distended.  No CVA tenderness.  GENITOURINARY:  Circumcised meatus adequate, no discharge. Testicles are normal.  RECTAL:  No rectal mass.  Prostate is 1.5+, smooth, and firm.  LABORATORIES:  On admission WBC 14.8,  yesterday came down to 11.6, hematocrit 42.3 on admission, 36.5 now.  Sodium 132, potassium 4.2, chloride 96, BUN 12, creatinine 1.1.  Urinalysis is essentially normal.  X-ray/CT of the abdomen and pelvis is done.  On admission abdominal CT is negative.  Pelvic CT shows diffuse thickening of the sigmoid colon with several associated fluid collections which may be consistent with abscesses. Doubt if there is any perforation but it could be just inflammatory process.  IMPRESSION AND PLAN:  Urinary retention.  It could be secondary to the sigmoid pathology.  I doubt if there is any primary urological problem.  We will check his residual of the urine.  If it is over 100 cc we will leave the catheter in.  Continue his Cipro and Flagyl.  Will follow. Dictated by:   Alleen Borne, M.D. Attending Physician:  Kirk Ruths DD:  11/01/00 TD:  11/03/00 Job: [id number]

## 2010-05-26 NOTE — H&P (Signed)
NAME:  Brian Rich, Brian Rich              ACCOUNT NO.:  [id number]   MEDICAL RECORD NO.:  [id number]          PATIENT TYPE:  INP   LOCATION:  5021                         FACILITY:  [facility]   PHYSICIAN:  Sharlet Salina [address], [place]   DATE OF BIRTH:  1955/04/12   DATE OF ADMISSION:  04/01/2006  DATE OF DISCHARGE:                              HISTORY & PHYSICAL   CHIEF COMPLAINT:  Right ankle pain.   HISTORY OF PRESENT ILLNESS:  This is a 55 year old male who was at work  with the [employer] this morning when a tree fell onto  the patient.  It fell on the patient while he was standing to the side,  and a tree fell across his shoulder and then across his right lower leg.  He had immediate pain and noticed a deformity.  He stated that he had to  actually twist his ankle back into tarsal orientation afterwards.  He  stated he has no previous history of injury to this right lower  extremity.  He stated that he had no loss of consciousness at the time.  No other aches or pains reported due to this injury of his shoulder or  other body parts.   PAST MEDICAL HISTORY:  Hypercholesterolemia, hypertension, anxiety and  depression.   FAMILY HISTORY:  Noncontributory.   SOCIAL HISTORY:  A former smoker.   DRUG ALLERGIES:  NO KNOWN DRUG ALLERGIES.   MEDICATIONS:  1. Zocor, unknown dosage.  2. Effexor 37.5 mg 1 p.o. every day.  3. Avalide 150/12.5 one p.o. every day.  4. Nexium 40 mg 1 p.o. every day.  5. Ambien 10 mg 1 p.o. q.h.s.  6. Xanax 1 mg 1 p.o. every day.   REVIEW OF SYSTEMS:  None other than HPI.   PHYSICAL EXAMINATION:  VITAL SIGNS:  Temperature 98.1, pulse 71,  respirations 16, blood pressure 138/85.  GENERAL:  Awake, alert and oriented.  Well-developed, well-nourished.  Had pain well controlled.  NECK:  Supple.  No carotid bruits.  CHEST:  Lungs clear to auscultation bilaterally.  BREASTS:  Deferred.  HEART:  Regular rate and rhythm without gallops, clicks, rubs or  murmurs.  ABDOMEN:  Soft, nontender, nondistended.  Bowel sounds present in all 4  quadrants.  GENITOURINARY:  Deferred.  EXTREMITIES:  Right lower extremity fracture blister on the distal  portion of his ankle.  Appears to be well-aligned.  Painful with any  sort of motion or movement.  He is immobilized with a pillow.  SKIN:  He has a dorsalis pedis pulse, which is positive.  As noted, he  has a fracture blister of the medial distal portion of the right lower  extremity.  There is no open breakthrough, and his calf is soft.  NEUROLOGIC:  He has intact distal accessibilities.   Labs pending, ordered in the emergency department, which included a CBC,  a CMET, a UA and a coag.  X-rays:  Chest x-ray 2-view ordered.  EKG  ordered.   IMPRESSION:  Closed right distal comminuted tibiofibular fracture.   PLAN OF ACTION:  External fixator of the right comminuted distal  tibiofibular fracture by surgeon, Dr. Durene Romans.  Risks and  complications were discussed.  Questions were encouraged, answered and  reviewed.           ______________________________  Yetta Glassman [address], [place]     BLM/MEDQ  D:  04/01/2006  T:  04/02/2006  Job:  [id number]

## 2010-05-26 NOTE — Discharge Summary (Signed)
[facility]  Patient:    Brian Rich, Brian Rich Visit Number: [id number] MRN: [id number]          Service Type: MED Location: 3A A305 01 Attending Physician:  Dalia Heading Dictated by:   Franky Macho, M.D. Admit Date:  01/28/2001 Discharge Date: 02/04/2001   CC:         Patrica Duel, M.D.  Dennie Maizes, M.D.   Discharge Summary  AGE:  55 years old.  HISTORY OF PRESENT ILLNESS:  The patient is a 55 year old white male with a history of sigmoid diverticulitis with intra-abdominal abscess formation in October 2002, who presented with pneumaturia.  He was admitted to the hospital for preoperative bowel preparation and intravenous antibiotic therapy due to a recurrent urinary tract infection.  HOSPITAL COURSE:  In conjunction with Dr. Rito Ehrlich, urology, the patient subsequently underwent a partial colectomy, cystoscopy, bilateral ureteral catheter insertion, and repair of colovesical fistula.  This was performed on January 29, 2001.  He tolerated the surgery well.  His postoperative course was, for the most part, unremarkable.  His diet was advanced without difficulty once his bowel function returned.  His urine was initially bloody but then cleared after several days.  He was continued on intravenous antibiotics throughout his stay.  He never required a blood transfusion.  The patient is being discharged home on February 04, 2001, in fair and improving condition.  FOLLOW-UP:  The patient is to follow up with Dr. Franky Macho on February 06, 2001, and with Dr. Rito Ehrlich on February 07, 2001.  DISCHARGE MEDICATIONS: 1. Vicodin 1-2 tablets p.o. q.4h. p.r.n. pain. 2. Paxil 20 mg p.o. q.d. 3. Xanax 1 mg p.o. q.8h. p.r.n. anxiety. 4. Nexium 20 mg p.o. q.d. 5. Amoxicillin 500 mg p.o. t.i.d. for five days.  DISCHARGE INSTRUCTIONS:  He will have his Foley removed by Dr. Rito Ehrlich on Friday.  PRINCIPAL DIAGNOSES: 1. Colovesical fistula. 2. Sigmoid  diverticulitis. 3. Anxiety disorder.  PRINCIPAL PROCEDURE:  Repair of colovesical fistula, cystoscopy, partial colectomy with low pelvic anastomosis, ureteral stent placement by Drs. Carles Collet on January 29, 2001. Dictated by:   Franky Macho, M.D. Attending Physician:  Dalia Heading DD:  02/04/01 TD:  02/04/01 Job: [id number]

## 2010-05-26 NOTE — Consult Note (Signed)
[facility]  Patient:    Brian Rich, Brian Rich Visit Number: [id number] MRN: [id number]          Service Type: MED Location: 3A [phone] Attending Physician:  Annamarie Dawley Dictated by:   Franky Macho, M.D. Proc. Date: 10/31/00 Admit Date:  10/30/2000   CC:         Karleen Hampshire, M.D.   Consultation Report  REASON FOR CONSULTATION:  Sigmoid diverticulitis.  HISTORY OF PRESENT ILLNESS:  The patient is a 54 year old white male who presented to the emergency room yesterday with worsening left lower quadrant abdominal pain.  He states he has had this for approximately a week and has been on oral antibiotics.  He had a similar episode earlier this year.  He was having mild fevers and gas pain.  He denies any hematochezia.  He does have a pressure sensation on his bladder.  PAST MEDICAL HISTORY:  Anxiety disorder.  PAST SURGICAL HISTORY:  Omphalocele surgery as a newborn, inguinal herniorrhaphy.  CURRENT MEDICATIONS:  Ciprofloxacin, Flagyl, Demerol.  ALLERGIES:  No known drug allergies.  REVIEW OF SYSTEMS:  The patient denies any significant cardiopulmonary difficulties.  He denies any bleeding disorder.  PHYSICAL EXAMINATION:  GENERAL:  The patient is an anxious 55 year old white male in no acute distress.  VITAL SIGNS:  Temperature 101.5.  Vital signs are stable.  ABDOMEN:  Soft with voluntary guarding and tenderness in the suprapubic and left lower quadrant region.  No rigidity is noted.  No significant distention is noted.  No masses are noted.  Rectal examination was deferred at this time.  LABORATORY DATA:  White blood cell count 14.8 with 76 segs, 15 lymnphs, hematocrit 42, platelet count 382.  Amylase and lipase within normal limits. MET-7 is within normal limits except for a slightly elevated alkaline phosphatase at 145, albumin 2.9.  CT scan of the abdomen and pelvis reveals diverticulitis in the sigmoid region with pericolonic  abscesses which are contained.  There is no free fluid in the abdomen and no pneumoperitoneum.  Diverticulum are seen.  IMPRESSION:  Sigmoid diverticulitis.  PLAN:  As the patient has no free perforation or free fluid within the abdominal cavity, surgery is not warranted at this time.  Agree with continuing antibiotics as ordered.  This should continue for approximately 10 days to two weeks.  Should his condition worsen, a colectomy with colostomy would be indicated.  This was explained to the patient who understands the treatment plan.  I will follow the patient closely with you. Dictated by:   Franky Macho, M.D. Attending Physician:  Annamarie Dawley DD:  10/31/00 TD:  11/01/00 Job: [id number]

## 2010-10-06 LAB — WOUND CULTURE: Culture: NO GROWTH

## 2010-10-06 LAB — TISSUE CULTURE: Culture: NO GROWTH

## 2010-10-06 LAB — GRAM STAIN: Gram Stain: NONE SEEN

## 2010-10-06 LAB — BASIC METABOLIC PANEL
Calcium: 9.3
Creatinine, Ser: 0.99
GFR calc Af Amer: >60
GFR calc non Af Amer: >60
Sodium: 138

## 2010-10-06 LAB — SEDIMENTATION RATE: Sed Rate: 1

## 2010-10-06 LAB — C-REACTIVE PROTEIN: CRP: 0.1 — ABNORMAL LOW (ref <0.6)

## 2010-10-06 LAB — CBC
Hemoglobin: 15.8
RBC: 5.13
RDW: 12.8

## 2010-10-06 LAB — ANAEROBIC CULTURE

## 2010-10-06 LAB — DIFFERENTIAL
Basophils Relative: 1
Lymphs Abs: 2.4
Monocytes Relative: 8
Neutro Abs: 6.3
Neutrophils Relative %: 66

## 2010-12-10 DIAGNOSIS — F329 Major depressive disorder, single episode, unspecified: Secondary | ICD-10-CM | POA: Insufficient documentation

## 2010-12-10 DIAGNOSIS — G894 Chronic pain syndrome: Secondary | ICD-10-CM | POA: Insufficient documentation

## 2010-12-10 DIAGNOSIS — F419 Anxiety disorder, unspecified: Secondary | ICD-10-CM | POA: Insufficient documentation

## 2011-11-03 IMAGING — CR DG CHEST 2V
3 series · 3 of 3 positions shown · non-contrast
Comparison: 09/03/2008

CLINICAL DATA: Cough, congestion, wheezing, shortness of breath,
former smoker

CHEST - 2 VIEW

[view not recorded (1 of 3)]
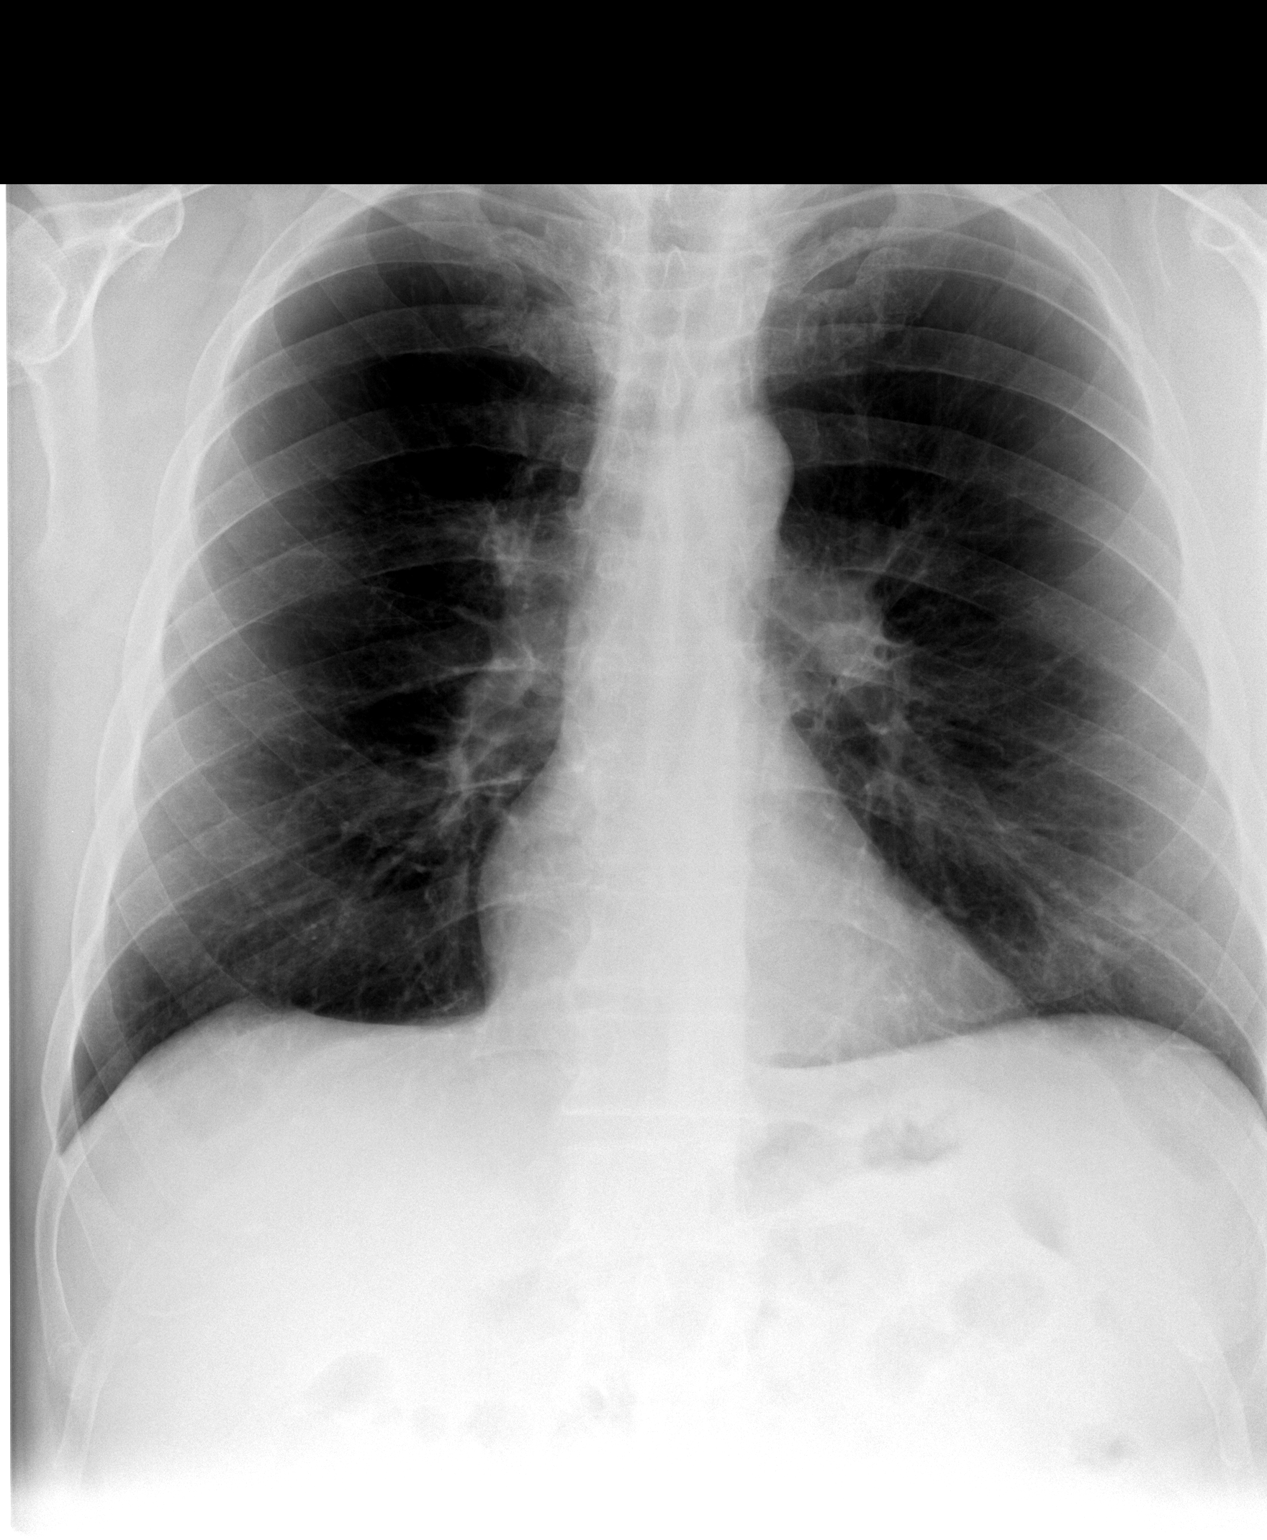

[view not recorded (2 of 3)]
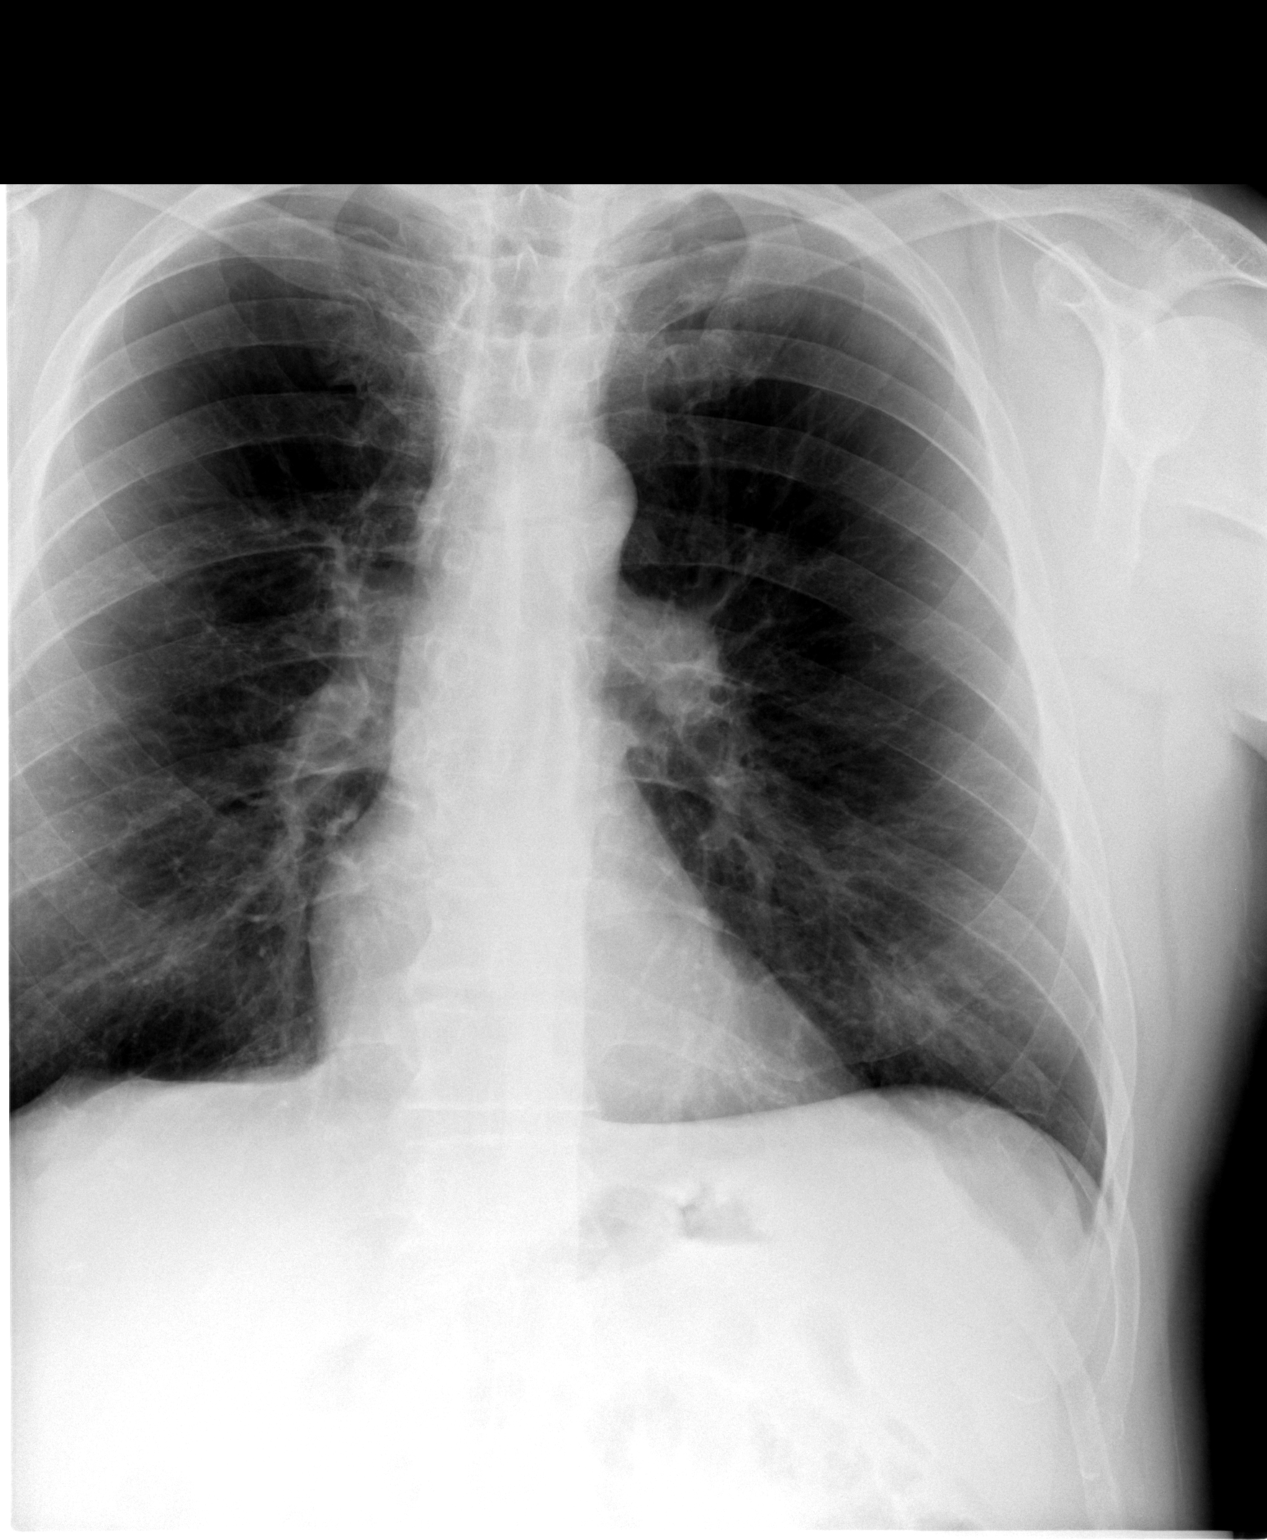

[view not recorded (3 of 3)]
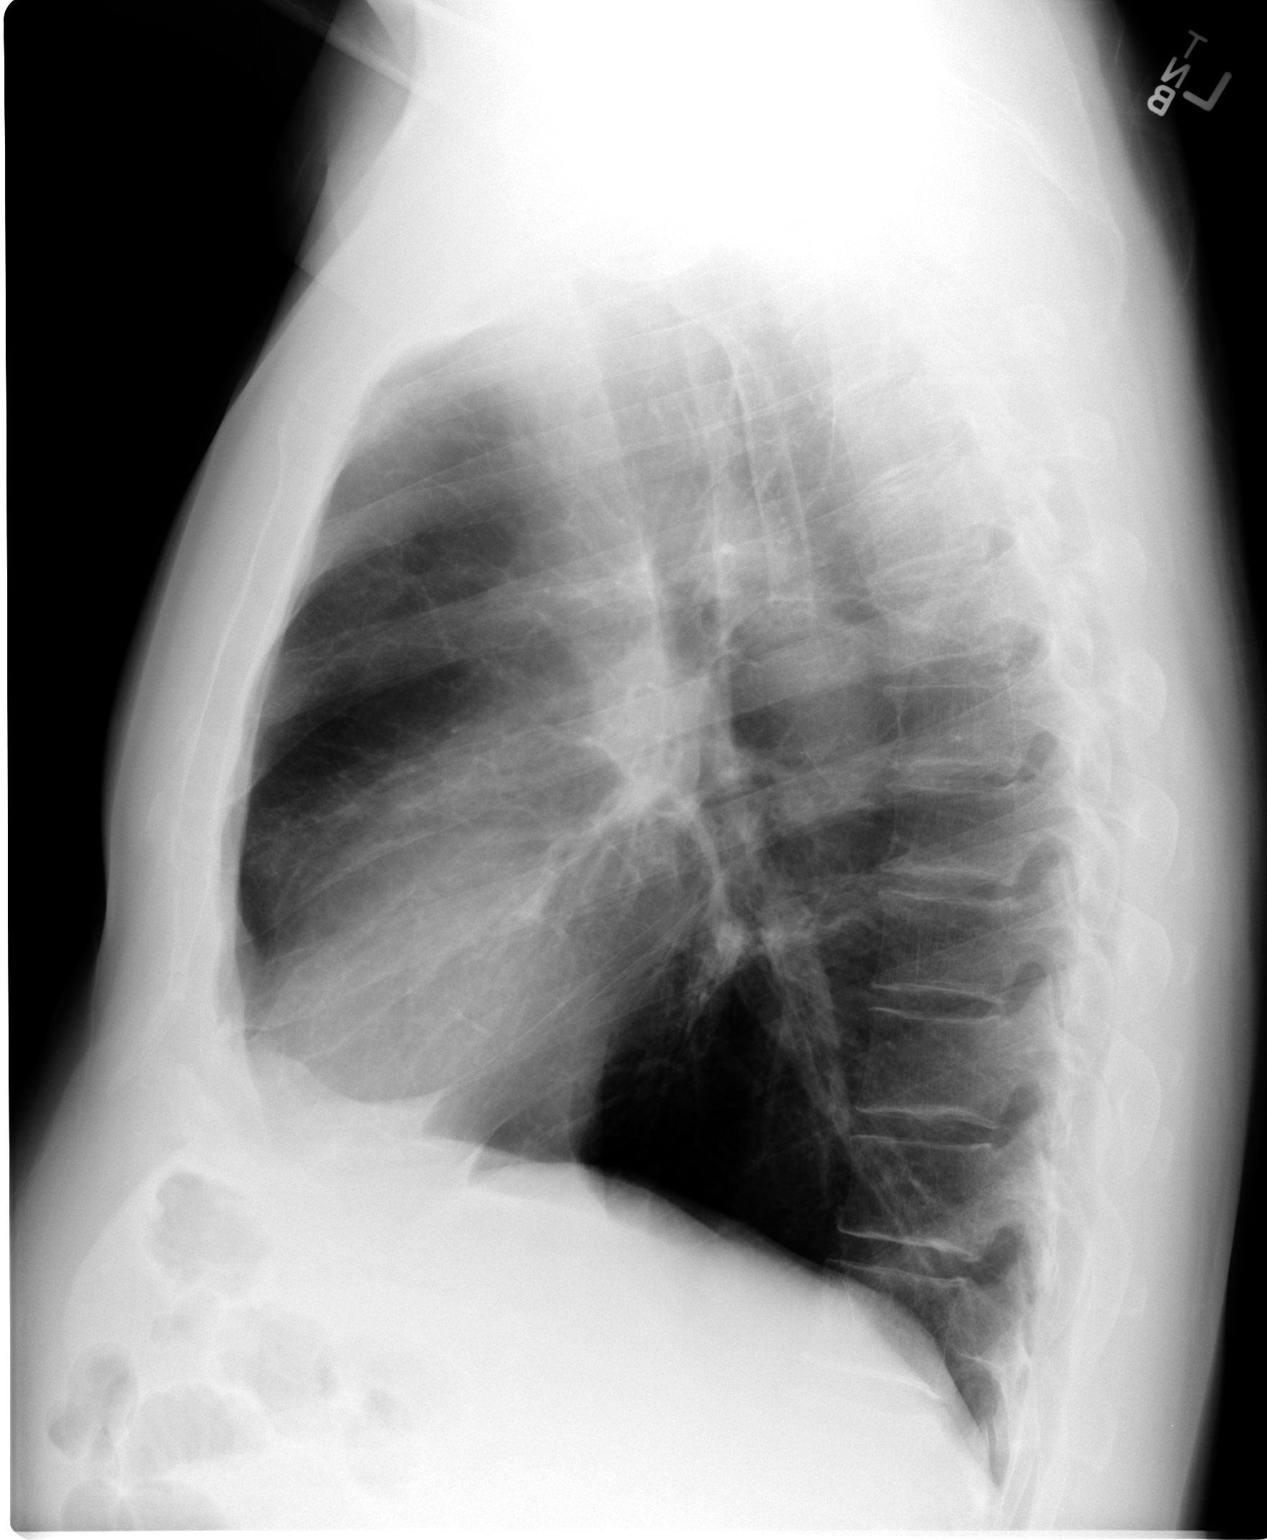

[3 of 3 positions shown; findings below may reference images not displayed]

FINDINGS: Normal heart size.
Slight prominent left hilum, unchanged since earlier 08/12/2007
exam.
Mediastinal contours and pulmonary vascularity otherwise normal.
Mild bronchitic and emphysematous changes without infiltrate or
effusion.
Bones unremarkable.
No pneumothorax.
IMPRESSION: Mild bronchitic and emphysematous changes compatible with COPD.
No acute abnormalities.

## 2015-07-29 ENCOUNTER — Encounter (HOSPITAL_COMMUNITY): Payer: Medicare Other | Attending: Hematology & Oncology | Admitting: Hematology & Oncology

## 2015-07-29 ENCOUNTER — Encounter (HOSPITAL_COMMUNITY): Payer: Self-pay | Admitting: Hematology & Oncology

## 2015-07-29 ENCOUNTER — Encounter (HOSPITAL_COMMUNITY): Payer: Medicare Other

## 2015-07-29 VITALS — BP 140/75 | HR 113 | Temp 97.8°F | Resp 18 | Ht 72.0 in

## 2015-07-29 DIAGNOSIS — G894 Chronic pain syndrome: Secondary | ICD-10-CM

## 2015-07-29 DIAGNOSIS — D729 Disorder of white blood cells, unspecified: Secondary | ICD-10-CM | POA: Diagnosis not present

## 2015-07-29 DIAGNOSIS — D72829 Elevated white blood cell count, unspecified: Secondary | ICD-10-CM | POA: Diagnosis present

## 2015-07-29 DIAGNOSIS — F329 Major depressive disorder, single episode, unspecified: Secondary | ICD-10-CM

## 2015-07-29 LAB — CBC WITH DIFFERENTIAL/PLATELET
BASOS ABS: 0.1 10*3/uL (ref 0.0–0.1)
Basophils Relative: 0 %
EOS PCT: 3 %
Eosinophils Absolute: 0.5 10*3/uL (ref 0.0–0.7)
HEMATOCRIT: 45.4 % (ref 39.0–52.0)
Hemoglobin: 15 g/dL (ref 13.0–17.0)
LYMPHS ABS: 4.8 10*3/uL — AB (ref 0.7–4.0)
LYMPHS PCT: 34 %
MCH: 31.2 pg (ref 26.0–34.0)
MCHC: 33 g/dL (ref 30.0–36.0)
MCV: 94.4 fL (ref 78.0–100.0)
MONO ABS: 1 10*3/uL (ref 0.1–1.0)
Monocytes Relative: 7 %
NEUTROS ABS: 7.7 10*3/uL (ref 1.7–7.7)
Neutrophils Relative %: 56 %
PLATELETS: 320 10*3/uL (ref 150–400)
RBC: 4.81 MIL/uL (ref 4.22–5.81)
RDW: 12.8 % (ref 11.5–15.5)
WBC: 14 10*3/uL — ABNORMAL HIGH (ref 4.0–10.5)

## 2015-07-29 LAB — SEDIMENTATION RATE: Sed Rate: 20 mm/hr — ABNORMAL HIGH (ref 0–16)

## 2015-07-29 LAB — COMPREHENSIVE METABOLIC PANEL
ALT: 17 U/L (ref 17–63)
AST: 18 U/L (ref 15–41)
Albumin: 4 g/dL (ref 3.5–5.0)
Alkaline Phosphatase: 79 U/L (ref 38–126)
Anion gap: 9 (ref 5–15)
BILIRUBIN TOTAL: 0.5 mg/dL (ref 0.3–1.2)
BUN: 10 mg/dL (ref 6–20)
CALCIUM: 8.6 mg/dL — AB (ref 8.9–10.3)
CHLORIDE: 96 mmol/L — AB (ref 101–111)
CO2: 29 mmol/L (ref 22–32)
CREATININE: 1.08 mg/dL (ref 0.61–1.24)
Glucose, Bld: 120 mg/dL — ABNORMAL HIGH (ref 65–99)
Potassium: 3.1 mmol/L — ABNORMAL LOW (ref 3.5–5.1)
Sodium: 134 mmol/L — ABNORMAL LOW (ref 135–145)
TOTAL PROTEIN: 7.4 g/dL (ref 6.5–8.1)

## 2015-07-29 LAB — C-REACTIVE PROTEIN: CRP: 1.2 mg/dL — ABNORMAL HIGH (ref <1.0)

## 2015-07-29 NOTE — Progress Notes (Signed)
[place]  CONSULT NOTE  No care team member to display  CHIEF COMPLAINTS/PURPOSE OF CONSULTATION:  Leukocytosis, predominant neutrophilia  HISTORY OF PRESENTING ILLNESS:  Brian Rich 60 y.o. male is here on referral for leukocytosis predominantly neutrophilia dating back to at least 2015. He is referred by Dr. Hilma Favors.   Brian Rich is accompanied by his wife and ambulates with cane. He has a brace on the right lower extremity. He makes jokes throughout our visit.   Reports he stays in bed most of the day, about 23.5 hours. He really only gets out of bed to use the bathroom. He avoids getting up because of how much his right leg hurts, this is from an old work related injury.. The more he is on it, the more it hurts. His wife brings him food to the bed. Notes that in 3 hours of being up, he is wiped out. If his leg hurts, he won't eat supper because all he wants is to get still. His leg was injured while working when a tree landed on it.  His leg surgeries were performed at [facility].   He denies any skin breakdown. When asked how his breathing is, he remarks "I can hold my breath a long time".   When asked if he had any infections in his right leg, the patient and his wife state "I don't know". His right leg pain is the same, not any worse. He was never told he had osteomyelitis or infection in his foot or leg.  Upon palpation of his abdomen, the patient states he has had abdominal tenderness since he was little and "his insides came out." His last colonoscopy was performed with Dr. Laural Golden and diverticulosis was noted.   Reports some hearing loss.  Denies B symptoms. Denies recent weight loss. Denies any change in baseline PS.    MEDICAL HISTORY:  Past Medical History  Diagnosis Date  . Hypertension   . COPD (chronic obstructive pulmonary disease) ([place])   . Elevated WBC count   . Chronic pain     SURGICAL HISTORY: Past Surgical History  Procedure  Laterality Date  . Right broken leg    . Rod in place to right leg      SOCIAL HISTORY: Social History   Social History  . Marital Status: Married    Spouse Name: N/A  . Number of Children: N/A  . Years of Education: N/A   Occupational History  . Not on file.   Social History Main Topics  . Smoking status: Former Research scientist (life sciences)  . Smokeless tobacco: Current User  . Alcohol Use: No  . Drug Use: No  . Sexual Activity: Not on file   Other Topics Concern  . Not on file   Social History Narrative  . No narrative on file   Married 15 years 1 son 0 grandchildren, 3 grand-dogs all [place] Ex smoker, smoked for 25 years He dips tobacco occasionally He worked for [employer] Born in [place]. Currently live in [place] line to [place] HISTORY: Family History  Problem Relation Age of Onset  . Diabetes Mother   . Cancer Father   . Heart failure Father   . Diabetes Sister     Mother is still living at 33 yo. She broke her hip but other than that she is healthy. Has diabetes, just takes metformin. Father deceased at 18 yo of congestive heart failure. He also had cancer. He was  shot in WWII.  5 siblings, all living. 1 sister has diabetes.  ALLERGIES:  has no allergies on file.  MEDICATIONS:  Current Outpatient Prescriptions  Medication Sig Dispense Refill  . ALPRAZolam (XANAX) 1 MG tablet     . aspirin 81 MG tablet Take 81 mg by mouth daily.    . clotrimazole-betamethasone (LOTRISONE) cream     . docusate sodium (COLACE) 100 MG capsule Take 100 mg by mouth 2 (two) times daily.    Marland Kitchen gabapentin (NEURONTIN) 300 MG capsule     . losartan-hydrochlorothiazide (HYZAAR) 50-12.5 MG tablet     . morphine (MS CONTIN) 30 MG 12 hr tablet     . NEXIUM 40 MG capsule     . oxyCODONE-acetaminophen (PERCOCET) 10-325 MG tablet     . simvastatin (ZOCOR) 10 MG tablet     . venlafaxine (EFFEXOR) 75 MG tablet      No current facility-administered medications for this visit.      Review of Systems  Constitutional: Negative.   HENT: Positive for hearing loss.   Eyes: Negative.   Respiratory: Negative.   Cardiovascular: Negative.   Gastrointestinal: Positive for abdominal pain.       Chronic abdominal pain  Genitourinary: Negative.   Musculoskeletal: Negative.        Right leg pain due to prior injury  Skin: Negative.   Neurological: Negative.   Endo/Heme/Allergies: Negative.   Psychiatric/Behavioral: Negative.   All other systems reviewed and are negative.  14 point ROS was done and is otherwise as detailed above or in HPI   PHYSICAL EXAMINATION: ECOG PERFORMANCE STATUS: 3 - Symptomatic, >50% confined to bed  Filed Vitals:   07/29/15 1400  BP: 140/75  Pulse: 113  Temp: 97.8 F (36.6 C)  Resp: 18    Physical Exam  Constitutional: He is oriented to person, place, and time and well-developed, well-nourished, and in no distress.  Ambulates with cane.   HENT:  Head: Normocephalic and atraumatic.  Mouth/Throat: Oropharynx is clear and moist.  Endentulous  Eyes: Conjunctivae and EOM are normal. Pupils are equal, round, and reactive to light. Right eye exhibits no discharge. Left eye exhibits no discharge. No scleral icterus.  Neck: Normal range of motion. Neck supple. No tracheal deviation present. No thyromegaly present.  Cardiovascular: Normal rate, regular rhythm and normal heart sounds.   No murmur heard. Pulmonary/Chest: Effort normal and breath sounds normal. No respiratory distress. He has no wheezes. He has no rales. He exhibits no tenderness.  Abdominal: Soft. Bowel sounds are normal. There is tenderness.  Mildly diffuse abdominal tenderness (chronic)  Musculoskeletal: Normal range of motion. He exhibits tenderness.  Brace on right lower extremity. Right lower extremity tenderness.  Neurological: He is alert and oriented to person, place, and time. No cranial nerve deficit.  Skin: Skin is warm and dry. He is not diaphoretic.   Psychiatric: Mood, memory, affect and judgment normal.  Nursing note and vitals reviewed.    LABORATORY DATA:  I have reviewed the data as listed Lab Results  Component Value Date   WBC 6.6 09/03/2008   HGB 15.3 09/03/2008   HCT 44.1 09/03/2008   MCV 89.6 09/03/2008   PLT 231 09/03/2008   CMP     Component Value Date/Time   NA 139 09/03/2008 0942   K 3.8 09/03/2008 0942   CL 100 09/03/2008 0942   CO2 32 09/03/2008 0942   GLUCOSE 99 09/03/2008 0942   BUN 14 09/03/2008 0942   CREATININE 1.11 09/03/2008  1791   CALCIUM 8.9 09/03/2008 0942   PROT 6.1 09/03/2008 0942   ALBUMIN 3.4* 09/03/2008 0942   AST 29 09/03/2008 0942   ALT 38 09/03/2008 0942   ALKPHOS 75 09/03/2008 0942   BILITOT 0.8 09/03/2008 0942   GFRNONAA >60 09/03/2008 0942   GFRAA  09/03/2008 0942    >60        The eGFR has been calculated using the MDRD equation. This calculation has not been validated in all clinical situations. eGFR's persistently <60 mL/min signify possible Chronic Kidney Disease.     ASSESSMENT & PLAN:  Leukocytosis Neutrophilia R leg injury, chronic RLE pain Inactivity History Depression  We discussed potential causes of chronic leukocytosis/neutrophilia. He no longer smokes. He does not recall ever having osteomyelitis complicating his prior leg injury. I have recommended blood work today for further evaluation and he is agreeable. We will regroup within 2 weeks to review.   Orders Placed This Encounter  Procedures  . CBC with Differential  . Comprehensive metabolic panel  . BCR-ABL1, CML/ALL, PCR, QUANT  . JAK2 V617F, Rfx CALR/E12/MPL  . IgG, IgA, IgM  . Sedimentation rate  . C-reactive protein  . CALR + JAK2 EXON12 + MPL    reviewed the potential causes of leukocytosis (specifically mild neutrophilia) including but not limited to:  ?Any active inflammatory condition or infection ?Cigarette smoking, which may be the most common cause of mild  neutrophilia ?Previously diagnosed hematologic disease (such as acute and chronic leukemias, chronic myeloproliferative or myelodysplastic disease) ?The presence of, and treatment for, a chronic anxiety state, panic disorder, rage, or emotional stress (eg, posttraumatic stress disorder, depression) ?Presence of non-hematologic diseases known to increase neutrophil counts (eg, eclampsia, thyroid storm, hypercortisolism). ?Prior splenectomy or known asplenia ?Positive family history of neutrophilia ?Recent vaccination  Medications - Various medications may cause neutrophilia. However,  such cases are rare and appear in the literature as isolated case reports.  Plan today is to proceed with a CBC with peripheral smear review, evaluation for MPD, BCR-ABL to r/o CML, CRP and ESR to look for occult inflammatory disease  All questions were answered. The patient knows to call the clinic with any problems, questions or concerns.  This document serves as a record of services personally performed by Ancil Linsey, MD. It was created on her behalf by Arlyce Harman, a trained medical scribe. The creation of this record is based on the scribe's personal observations and the provider's statements to them. This document has been checked and approved by the attending provider.  I have reviewed the above documentation for accuracy and completeness, and I agree with the above.  This note was electronically signed.    Molli Hazard, MD  07/29/2015 3:01 PM

## 2015-07-29 NOTE — Patient Instructions (Signed)
[facility] at [facility] Discharge Instructions  RECOMMENDATIONS MADE BY THE CONSULTANT AND ANY TEST RESULTS WILL BE SENT TO YOUR REFERRING PHYSICIAN.  You were seen by Dr. Galen ManilaPenland today. Return to Clinic in 2 weeks for follow up Please call clinic with concerns.  Thank you for choosing [facility] at [facility] to provide your oncology and hematology care.  To afford each patient quality time with our provider, please arrive at least 15 minutes before your scheduled appointment time.   Beginning January 23rd 2017 lab work for the [facility] will be done in the  Main lab at [facility] on 1st floor. If you have a lab appointment with the Cancer Center please come in thru the  Main Entrance and check in at the main information desk  You need to re-schedule your appointment should you arrive 10 or more minutes late.  We strive to give you quality time with our providers, and arriving late affects you and other patients whose appointments are after yours.  Also, if you no show three or more times for appointments you may be dismissed from the clinic at the providers discretion.     Again, thank you for choosing [facility].  Our hope is that these requests will decrease the amount of time that you wait before being seen by our physicians.       _____________________________________________________________  Should you have questions after your visit to [facility], please contact our office at [phone] between the hours of 8:30 a.m. and 4:30 p.m.  Voicemails left after 4:30 p.m. will not be returned until the following business day.  For prescription refill requests, have your pharmacy contact our office.         Resources For Cancer Patients and their Caregivers ? American Cancer Society: Can assist with transportation, wigs, general needs, runs Look Good Feel Better.        [phone] ? Cancer  Care: Provides financial assistance, online support groups, medication/co-pay assistance.  1-800-813-HOPE 231-823-6795(4673) ? [facility] Assists [place] cancer patients and their families through emotional , educational and financial support.  [phone] ? [place] DSS Where to apply for food stamps, Medicaid and utility assistance. [phone] [phone] ? RCATS: Transportation to medical appointments. 706-437-9655762 [phone] ? Social Security Administration: May apply for disability if have a Stage IV cancer. [phone] ? [place] A[place], Disability and Transit Services: Assists with nutrition, care and transit needs. [phone]  Cancer Center Support Programs: @10RELATIVEDAYS @ > Cancer Support Group  2nd Tuesday of the month 1pm-2pm, Journey Room  > Creative Journey  3rd Tuesday of the month 1130am-1pm, Journey Room  > Look Good Feel Better  1st Wednesday of the month 10am-12 noon, Journey Room (Call American Cancer Society to register [phone])

## 2015-07-30 LAB — IGG, IGA, IGM
IGG (IMMUNOGLOBIN G), SERUM: 780 mg/dL (ref 700–1600)
IgA: 260 mg/dL (ref 90–386)
IgM, Serum: 61 mg/dL (ref 20–172)

## 2015-08-02 DIAGNOSIS — D729 Disorder of white blood cells, unspecified: Secondary | ICD-10-CM | POA: Diagnosis not present

## 2015-08-02 LAB — BCR-ABL1, CML/ALL, PCR, QUANT

## 2015-08-11 LAB — CALR + JAK2 E12-15 + MPL (REFLEXED)

## 2015-08-11 LAB — JAK2 V617F, W REFLEX TO CALR/E12/MPL

## 2015-08-11 MED ORDER — DARBEPOETIN ALFA 150 MCG/0.3ML IJ SOSY
PREFILLED_SYRINGE | INTRAMUSCULAR | Status: AC
  Filled 2015-08-11: qty 0.3

## 2015-08-18 ENCOUNTER — Encounter (HOSPITAL_COMMUNITY): Payer: Medicare Other | Attending: Hematology & Oncology | Admitting: Oncology

## 2015-08-18 ENCOUNTER — Encounter (HOSPITAL_COMMUNITY): Payer: Self-pay | Admitting: Oncology

## 2015-08-18 VITALS — BP 106/80 | HR 119 | Temp 97.7°F | Resp 20 | Wt 181.1 lb

## 2015-08-18 DIAGNOSIS — E876 Hypokalemia: Secondary | ICD-10-CM

## 2015-08-18 DIAGNOSIS — D72829 Elevated white blood cell count, unspecified: Secondary | ICD-10-CM | POA: Diagnosis not present

## 2015-08-18 DIAGNOSIS — D729 Disorder of white blood cells, unspecified: Secondary | ICD-10-CM | POA: Insufficient documentation

## 2015-08-18 HISTORY — DX: Elevated white blood cell count, unspecified: D72.829

## 2015-08-18 MED ORDER — POTASSIUM CHLORIDE CRYS ER 20 MEQ PO TBCR
20.0000 meq | EXTENDED_RELEASE_TABLET | Freq: Two times a day (BID) | ORAL | 0 refills | Status: DC
Start: 2015-08-18 — End: 2015-09-09

## 2015-08-18 NOTE — Assessment & Plan Note (Signed)
Leukocytosis with lymphocytosis.  Work-up is negative for primary bone marrow issue including BCR/ABL, JAK2V617F with reflex to JAK2/CALR/MPL.    No labs today.  Previous labs reviewed in detail.  I personally reviewed and went over laboratory results with the patient.  The results are noted within this dictation.  His work-up has been negative to date.  Of note, he has a minimally elevated ESR and CRP.  He does have a hypokalemia which is likely secondary to his HCTZ-containing antihypertensive.  Kdur 40 mEq daily is escribed for 30 days.  This should be managed by primary care provider moving forward.  Of note, FLOW has not been performed, nor a bone marrow aspiration ans biopsy.  He is asymptomatic.  Labs in 3 months: CBC diff, pathology smear review.  Return in 3 months for follow-up. 

## 2015-08-18 NOTE — Progress Notes (Signed)
No primary care provider on file. No primary provider on file.  Leukocytosis - Plan: CBC with Differential, Pathologist smear review  Hypokalemia - Plan: potassium chloride SA (K-DUR,KLOR-CON) 20 MEQ tablet, Potassium  CURRENT THERAPY: Work-up  INTERVAL HISTORY: Brian Rich 60 y.o. male returns for followup of Leukocytosis with negative work-up for primary bone marrow disorder including BCR/ABL, JAK2V617F with reflex to JAK2/CALR/MPL.    Patient denies any B symptoms. He reports being very nervous and anxious about today's visit. He otherwise denies any complaints.  Review of Systems  Constitutional: Negative.  Negative for chills, fever and weight loss.  HENT: Negative.   Eyes: Negative.   Respiratory: Negative.  Negative for cough and shortness of breath.   Cardiovascular: Negative.  Negative for chest pain.  Gastrointestinal: Negative.   Genitourinary: Negative.   Musculoskeletal: Negative.   Skin: Negative.   Neurological: Negative.  Negative for weakness.  Endo/Heme/Allergies: Negative.  Does not bruise/bleed easily.  Psychiatric/Behavioral: The patient is nervous/anxious.     Past Medical History:  Diagnosis Date  . Chronic pain   . COPD (chronic obstructive pulmonary disease) ([place])   . Elevated WBC count   . Hypertension   . Leukocytosis 08/18/2015    Past Surgical History:  Procedure Laterality Date  . right broken leg    . rod in place to right leg      Family History  Problem Relation Age of Onset  . Diabetes Mother   . Cancer Father   . Heart failure Father   . Diabetes Sister     Social History   Social History  . Marital status: Married    Spouse name: N/A  . Number of children: N/A  . Years of education: N/A   Social History Main Topics  . Smoking status: Former Research scientist (life sciences)  . Smokeless tobacco: Current User  . Alcohol use No  . Drug use: No  . Sexual activity: Not Asked   Other Topics Concern  . None   Social History  Narrative  . None     PHYSICAL EXAMINATION  ECOG PERFORMANCE STATUS: 1 - Symptomatic but completely ambulatory  Vitals:   08/18/15 1200  BP: 106/80  Pulse: (!) 119  Resp: 20  Temp: 97.7 F (36.5 C)    GENERAL:alert, no distress, well nourished, well developed, anxious, comfortable, cooperative and accompanied by his wife. SKIN: skin color, texture, turgor are normal, no rashes or significant lesions HEAD: Normocephalic, No masses, lesions, tenderness or abnormalities EYES: normal, EOMI, Conjunctiva are pink and non-injected EARS: External ears normal OROPHARYNX:lips, buccal mucosa, and tongue normal and mucous membranes are moist  NECK: supple, trachea midline LYMPH:  no palpable lymphadenopathy BREAST:not examined LUNGS: not examined HEART: not examined ABDOMEN:not examined BACK: Back symmetric, no curvature. EXTREMITIES:less then 2 second capillary refill, no skin discoloration  NEURO: alert & oriented x 3 with fluent speech, no focal motor/sensory deficits.   LABORATORY DATA: CBC    Component Value Date/Time   WBC 14.0 (H) 07/29/2015 1601   RBC 4.81 07/29/2015 1601   HGB 15.0 07/29/2015 1601   HCT 45.4 07/29/2015 1601   PLT 320 07/29/2015 1601   MCV 94.4 07/29/2015 1601   MCH 31.2 07/29/2015 1601   MCHC 33.0 07/29/2015 1601   RDW 12.8 07/29/2015 1601   LYMPHSABS 4.8 (H) 07/29/2015 1601   MONOABS 1.0 07/29/2015 1601   EOSABS 0.5 07/29/2015 1601   BASOSABS 0.1 07/29/2015 1601      Chemistry  Component Value Date/Time   NA 134 (L) 07/29/2015 1601   K 3.1 (L) 07/29/2015 1601   CL 96 (L) 07/29/2015 1601   CO2 29 07/29/2015 1601   BUN 10 07/29/2015 1601   CREATININE 1.08 07/29/2015 1601      Component Value Date/Time   CALCIUM 8.6 (L) 07/29/2015 1601   ALKPHOS 79 07/29/2015 1601   AST 18 07/29/2015 1601   ALT 17 07/29/2015 1601   BILITOT 0.5 07/29/2015 1601        PENDING LABS:   RADIOGRAPHIC STUDIES:  No results  found.   PATHOLOGY:    ASSESSMENT AND PLAN:  Leukocytosis Leukocytosis with lymphocytosis.  Work-up is negative for primary bone marrow issue including BCR/ABL, JAK2V617F with reflex to JAK2/CALR/MPL.    No labs today.  Previous labs reviewed in detail.  I personally reviewed and went over laboratory results with the patient.  The results are noted within this dictation.  His work-up has been negative to date.  Of note, he has a minimally elevated ESR and CRP.  He does have a hypokalemia which is likely secondary to his HCTZ-containing antihypertensive.  Kdur 40 mEq daily is escribed for 30 days.  This should be managed by primary care provider moving forward.  Of note, FLOW has not been performed, nor a bone marrow aspiration ans biopsy.  He is asymptomatic.  Labs in 3 months: CBC diff, pathology smear review.  Return in 3 months for follow-up.   ORDERS PLACED FOR THIS ENCOUNTER: Orders Placed This Encounter  Procedures  . CBC with Differential  . Pathologist smear review  . Potassium    MEDICATIONS PRESCRIBED THIS ENCOUNTER: Meds ordered this encounter  Medications  . potassium chloride SA (K-DUR,KLOR-CON) 20 MEQ tablet    Sig: Take 1 tablet (20 mEq total) by mouth 2 (two) times daily.    Dispense:  60 tablet    Refill:  0    Order Specific Question:   Supervising Provider    Answer:   Patrici Ranks [id number]    THERAPY PLAN:  Observation  All questions were answered. The patient knows to call the clinic with any problems, questions or concerns. We can certainly see the patient much sooner if necessary.  Patient and plan discussed with Dr. Ancil Linsey and she is in agreement with the aforementioned.   This note is electronically signed by: Doy Mince 08/18/2015 7:15 PM

## 2015-08-18 NOTE — Patient Instructions (Signed)
[facility] at [facility] Discharge Instructions  RECOMMENDATIONS MADE BY THE CONSULTANT AND ANY TEST RESULTS WILL BE SENT TO YOUR REFERRING PHYSICIAN.  You were seen by Elijah Birkom today. Prescription for K-dur has been escribed Return to Center in 3 months for follow up with labs  Thank you for choosing [facility] at [facility] to provide your oncology and hematology care.  To afford each patient quality time with our provider, please arrive at least 15 minutes before your scheduled appointment time.   Beginning January 23rd 2017 lab work for the [facility] will be done in the  Main lab at [facility] on 1st floor. If you have a lab appointment with the Cancer Center please come in thru the  Main Entrance and check in at the main information desk  You need to re-schedule your appointment should you arrive 10 or more minutes late.  We strive to give you quality time with our providers, and arriving late affects you and other patients whose appointments are after yours.  Also, if you no show three or more times for appointments you may be dismissed from the clinic at the providers discretion.     Again, thank you for choosing [facility].  Our hope is that these requests will decrease the amount of time that you wait before being seen by our physicians.       _____________________________________________________________  Should you have questions after your visit to [facility], please contact our office at [phone] between the hours of 8:30 a.m. and 4:30 p.m.  Voicemails left after 4:30 p.m. will not be returned until the following business day.  For prescription refill requests, have your pharmacy contact our office.         Resources For Cancer Patients and their Caregivers ? American Cancer Society: Can assist with transportation, wigs, general needs, runs Look Good Feel Better.        [phone] ? Cancer  Care: Provides financial assistance, online support groups, medication/co-pay assistance.  1-800-813-HOPE [phone](4673) ? [facility] Assists [place] cancer patients and their families through emotional , educational and financial support.  [phone] ? [place] DSS Where to apply for food stamps, Medicaid and utility assistance. 250-263-4392([phone] ? RCATS: Transportation to medical appointments. [phone] ? Social Security Administration: May apply for disability if have a Stage IV cancer. [phone] [phone] ? [place] Aging, Disability and Transit Services: Assists with nutrition, care and transit needs. [phone]  Cancer Center Support Programs: @10RELATIVEDAYS @ > Cancer Support Group  2nd Tuesday of the month 1pm-2pm, Journey Room  > Creative Journey  3rd Tuesday of the month 1130am-1pm, Journey Room  > Look Good Feel Better  1st Wednesday of the month 10am-12 noon, Journey Room (Call American Cancer Society to register [phone])

## 2015-08-20 ENCOUNTER — Encounter (HOSPITAL_COMMUNITY): Payer: Self-pay | Admitting: Hematology & Oncology

## 2015-09-09 ENCOUNTER — Other Ambulatory Visit (HOSPITAL_COMMUNITY): Payer: Self-pay | Admitting: Oncology

## 2015-09-09 DIAGNOSIS — E876 Hypokalemia: Secondary | ICD-10-CM

## 2015-10-10 ENCOUNTER — Other Ambulatory Visit (HOSPITAL_COMMUNITY): Payer: Self-pay | Admitting: Oncology

## 2015-10-10 DIAGNOSIS — E876 Hypokalemia: Secondary | ICD-10-CM

## 2015-11-08 ENCOUNTER — Other Ambulatory Visit (HOSPITAL_COMMUNITY): Payer: Self-pay | Admitting: Oncology

## 2015-11-08 DIAGNOSIS — E876 Hypokalemia: Secondary | ICD-10-CM

## 2015-11-18 ENCOUNTER — Ambulatory Visit (HOSPITAL_COMMUNITY): Payer: Medicare Other | Admitting: Oncology

## 2015-11-18 ENCOUNTER — Other Ambulatory Visit (HOSPITAL_COMMUNITY): Payer: Medicare Other

## 2015-11-30 ENCOUNTER — Other Ambulatory Visit (HOSPITAL_COMMUNITY): Payer: Medicare Other

## 2015-11-30 ENCOUNTER — Ambulatory Visit (HOSPITAL_COMMUNITY): Payer: Medicare Other | Admitting: Oncology

## 2015-12-08 ENCOUNTER — Other Ambulatory Visit (HOSPITAL_COMMUNITY): Payer: Self-pay | Admitting: Oncology

## 2015-12-08 DIAGNOSIS — E876 Hypokalemia: Secondary | ICD-10-CM

## 2015-12-09 ENCOUNTER — Encounter (HOSPITAL_COMMUNITY): Payer: Self-pay | Admitting: Oncology

## 2015-12-09 ENCOUNTER — Encounter (HOSPITAL_COMMUNITY): Payer: Medicare Other | Attending: Oncology | Admitting: Oncology

## 2015-12-09 ENCOUNTER — Encounter (HOSPITAL_COMMUNITY): Payer: Medicare Other

## 2015-12-09 VITALS — BP 86/67 | HR 125 | Temp 97.7°F | Resp 18 | Wt 178.0 lb

## 2015-12-09 DIAGNOSIS — D72820 Lymphocytosis (symptomatic): Secondary | ICD-10-CM | POA: Diagnosis not present

## 2015-12-09 DIAGNOSIS — E876 Hypokalemia: Secondary | ICD-10-CM

## 2015-12-09 DIAGNOSIS — D72829 Elevated white blood cell count, unspecified: Secondary | ICD-10-CM

## 2015-12-09 LAB — CBC WITH DIFFERENTIAL/PLATELET
Basophils Absolute: 0 10*3/uL (ref 0.0–0.1)
Basophils Relative: 0 %
Eosinophils Absolute: 0.3 10*3/uL (ref 0.0–0.7)
Eosinophils Relative: 2 %
HCT: 46.7 % (ref 39.0–52.0)
Hemoglobin: 15.3 g/dL (ref 13.0–17.0)
Lymphocytes Relative: 21 %
Lymphs Abs: 3.4 10*3/uL (ref 0.7–4.0)
MCH: 31.8 pg (ref 26.0–34.0)
MCHC: 32.8 g/dL (ref 30.0–36.0)
MCV: 97.1 fL (ref 78.0–100.0)
Monocytes Absolute: 1.2 10*3/uL — ABNORMAL HIGH (ref 0.1–1.0)
Monocytes Relative: 7 %
Neutro Abs: 11.3 10*3/uL — ABNORMAL HIGH (ref 1.7–7.7)
Neutrophils Relative %: 70 %
Platelets: 380 10*3/uL (ref 150–400)
RBC: 4.81 MIL/uL (ref 4.22–5.81)
RDW: 13.7 % (ref 11.5–15.5)
WBC: 16.2 10*3/uL — ABNORMAL HIGH (ref 4.0–10.5)

## 2015-12-09 LAB — POTASSIUM: Potassium: 3.4 mmol/L — ABNORMAL LOW (ref 3.5–5.1)

## 2015-12-09 NOTE — Assessment & Plan Note (Signed)
Leukocytosis with lymphocytosis.  Work-up is negative for primary bone marrow issue including BCR/ABL, JAK2V617F with reflex to JAK2/CALR/MPL.    Labs today: CBC diff, K+, and pathology smear review.  I personally reviewed and went over laboratory results with the patient.  The results are noted within this dictation.  Of note, FLOW has not been performed, nor a bone marrow aspiration and biopsy.  He is asymptomatic.  Labs in 4 months: CBC diff, pathology smear review.  Return in 4 months for follow-up.

## 2015-12-09 NOTE — Patient Instructions (Signed)
[facility] at [facility] Discharge Instructions  RECOMMENDATIONS MADE BY THE CONSULTANT AND ANY TEST RESULTS WILL BE SENT TO YOUR REFERRING PHYSICIAN.  You were seen today by Jenita Seashoreom Kefalas PA-C. Return in 4 months for labs and follow up.   Thank you for choosing [facility] at [facility] to provide your oncology and hematology care.  To afford each patient quality time with our provider, please arrive at least 15 minutes before your scheduled appointment time.   Beginning January 23rd 2017 lab work for the [facility] will be done in the  Main lab at [facility] on 1st floor. If you have a lab appointment with the Cancer Center please come in thru the  Main Entrance and check in at the main information desk  You need to re-schedule your appointment should you arrive 10 or more minutes late.  We strive to give you quality time with our providers, and arriving late affects you and other patients whose appointments are after yours.  Also, if you no show three or more times for appointments you may be dismissed from the clinic at the providers discretion.     Again, thank you for choosing [facility].  Our hope is that these requests will decrease the amount of time that you wait before being seen by our physicians.       _____________________________________________________________  Should you have questions after your visit to [facility], please contact our office at [phone] between the hours of 8:30 a.m. and 4:30 p.m.  Voicemails left after 4:30 p.m. will not be returned until the following business day.  For prescription refill requests, have your pharmacy contact our office.         Resources For Cancer Patients and their Caregivers ? American Cancer Society: Can assist with transportation, wigs, general needs, runs Look Good Feel Better.        201 [phone] ? Cancer Care: Provides financial assistance,  online support groups, medication/co-pay assistance.  1-800-813-HOPE 657-050-1221(4673) ? [facility] Assists [place] cancer patients and their families through emotional , educational and financial support.  [phone] ? [place] DSS Where to apply for food stamps, Medicaid and utility assistance. 214-825-8526671-228-8613 ? RCATS: Transportation to medical appointments. [phone] ? Social Security Administration: May apply for disability if have a Stage IV cancer. [phone] 574 [phone] ? [place] A[place], Disability and Transit Services: Assists with nutrition, care and transit needs. 332-847-1280([phone]  Cancer Center Support Programs: @10RELATIVEDAYS @ > Cancer Support Group  2nd Tuesday of the month 1pm-2pm, Journey Room  > Creative Journey  3rd Tuesday of the month 1130am-1pm, Journey Room  > Look Good Feel Better  1st Wednesday of the month 10am-12 noon, Journey Room (Call American Cancer Society to register [phone]

## 2015-12-09 NOTE — Progress Notes (Signed)
Colette RibasGOLDING, JOHN CABOT, MD [address] [place] [id number]  Lymphocytosis - Plan: CBC with Differential, Pathologist smear review  CURRENT THERAPY: Observation  INTERVAL HISTORY: Leonidas RombergJesse J Reetz 60 y.o. male returns for followup of Leukocytosis with negative work-up for primary bone marrow disorder including BCR/ABL, [id number] with reflex to JAK2/CALR/MPL.    Patient denies any B symptoms.  Weight is down 3 lbs compared to 3 months ago.  He notes an appetite that "comes and goes."  He otherwise denies any complaints.  He had a nice Thanksgiving.  Review of Systems  Constitutional: Negative.  Negative for chills, fever and weight loss.  HENT: Negative.   Eyes: Negative.   Respiratory: Negative.  Negative for cough and shortness of breath.   Cardiovascular: Negative.  Negative for chest pain.  Gastrointestinal: Negative.   Genitourinary: Negative.   Musculoskeletal: Negative.   Skin: Negative.   Neurological: Negative.  Negative for weakness.  Endo/Heme/Allergies: Negative.  Does not bruise/bleed easily.  Psychiatric/Behavioral: The patient is nervous/anxious.     Past Medical History:  Diagnosis Date  . Chronic pain   . COPD (chronic obstructive pulmonary disease) (HCC)   . Elevated WBC count   . Hypertension   . Leukocytosis 08/18/2015    Past Surgical History:  Procedure Laterality Date  . right broken leg    . rod in place to right leg      Family History  Problem Relation Age of Onset  . Diabetes Mother   . Cancer Father   . Heart failure Father   . Diabetes Sister     Social History   Social History  . Marital status: Married    Spouse name: N/A  . Number of children: N/A  . Years of education: N/A   Social History Main Topics  . Smoking status: Former Games developermoker  . Smokeless tobacco: Current User  . Alcohol use No  . Drug use: No  . Sexual activity: Not on file   Other Topics Concern  . Not on file   Social History Narrative   . No narrative on file     PHYSICAL EXAMINATION  ECOG PERFORMANCE STATUS: 1 - Symptomatic but completely ambulatory  Vitals:   12/09/15 1040  BP: (!) 86/67  Pulse: (!) 125  Resp: 18  Temp: 97.7 F (36.5 C)    GENERAL:alert, no distress, well nourished, well developed, anxious, comfortable, cooperative and unaccompanied today. SKIN: skin color, texture, turgor are normal, no rashes or significant lesions HEAD: Normocephalic, No masses, lesions, tenderness or abnormalities EYES: normal, EOMI, Conjunctiva are pink and non-injected EARS: External ears normal OROPHARYNX:lips, buccal mucosa, and tongue normal and mucous membranes are moist  NECK: supple, trachea midline LYMPH:  no palpable lymphadenopathy BREAST:not examined LUNGS: clear to ausculation bilaterally without any wheezes, rales, or rhonchi. HEART: regular rate and rhythm without murmur, rub, or gallop.  Normal S1 and S2. ABDOMEN: Abdomen is soft, nontender. BACK: Back symmetric, no curvature. EXTREMITIES:less then 2 second capillary refill, no skin discoloration  NEURO: alert & oriented x 3 with fluent speech, no focal motor/sensory deficits.   LABORATORY DATA: CBC    Component Value Date/Time   WBC 14.0 (H) 07/29/2015 1601   RBC 4.81 07/29/2015 1601   HGB 15.0 07/29/2015 1601   HCT 45.4 07/29/2015 1601   PLT 320 07/29/2015 1601   MCV 94.4 07/29/2015 1601   MCH 31.2 07/29/2015 1601   MCHC 33.0 07/29/2015 1601   RDW 12.8 07/29/2015 1601  LYMPHSABS 4.8 (H) 07/29/2015 1601   MONOABS 1.0 07/29/2015 1601   EOSABS 0.5 07/29/2015 1601   BASOSABS 0.1 07/29/2015 1601      Chemistry      Component Value Date/Time   NA 134 (L) 07/29/2015 1601   K 3.1 (L) 07/29/2015 1601   CL 96 (L) 07/29/2015 1601   CO2 29 07/29/2015 1601   BUN 10 07/29/2015 1601   CREATININE 1.08 07/29/2015 1601      Component Value Date/Time   CALCIUM 8.6 (L) 07/29/2015 1601   ALKPHOS 79 07/29/2015 1601   AST 18 07/29/2015 1601   ALT  17 07/29/2015 1601   BILITOT 0.5 07/29/2015 1601        PENDING LABS:   RADIOGRAPHIC STUDIES:  No results found.   PATHOLOGY:    ASSESSMENT AND PLAN:  Leukocytosis Leukocytosis with lymphocytosis.  Work-up is negative for primary bone marrow issue including BCR/ABL, JAK2V617F with reflex to JAK2/CALR/MPL.    Labs today: CBC diff, K+, and pathology smear review.  I personally reviewed and went over laboratory results with the patient.  The results are noted within this dictation.  Of note, FLOW has not been performed, nor a bone marrow aspiration and biopsy.  He is asymptomatic.  Labs in 4 months: CBC diff, pathology smear review.  Return in 4 months for follow-up.   ORDERS PLACED FOR THIS ENCOUNTER: Orders Placed This Encounter  Procedures  . CBC with Differential  . Pathologist smear review    MEDICATIONS PRESCRIBED THIS ENCOUNTER: No orders of the defined types were placed in this encounter.   THERAPY PLAN:  Observation  All questions were answered. The patient knows to call the clinic with any problems, questions or concerns. We can certainly see the patient much sooner if necessary.  Patient and plan discussed with Dr. Loma MessingShannon Penland and she is in agreement with the aforementioned.   This note is electronically signed by: Tina GriffithsKEFALAS,Stonewall Doss, PA-C 12/09/2015 11:37 AM

## 2015-12-12 LAB — PATHOLOGIST SMEAR REVIEW

## 2016-01-09 ENCOUNTER — Other Ambulatory Visit (HOSPITAL_COMMUNITY): Payer: Self-pay | Admitting: Oncology

## 2016-01-09 DIAGNOSIS — E876 Hypokalemia: Secondary | ICD-10-CM

## 2016-04-16 ENCOUNTER — Ambulatory Visit (HOSPITAL_COMMUNITY): Payer: Medicare Other

## 2016-04-16 ENCOUNTER — Other Ambulatory Visit (HOSPITAL_COMMUNITY): Payer: Medicare Other

## 2016-06-14 ENCOUNTER — Ambulatory Visit (HOSPITAL_COMMUNITY): Payer: Medicare Other

## 2016-06-14 ENCOUNTER — Other Ambulatory Visit (HOSPITAL_COMMUNITY): Payer: Medicare Other

## 2016-09-04 ENCOUNTER — Other Ambulatory Visit (HOSPITAL_COMMUNITY): Payer: Medicare Other

## 2016-09-04 ENCOUNTER — Ambulatory Visit (HOSPITAL_COMMUNITY): Payer: Medicare Other

## 2016-09-24 ENCOUNTER — Other Ambulatory Visit (HOSPITAL_COMMUNITY): Payer: Self-pay | Admitting: *Deleted

## 2016-09-24 DIAGNOSIS — D72829 Elevated white blood cell count, unspecified: Secondary | ICD-10-CM

## 2016-09-25 ENCOUNTER — Encounter (HOSPITAL_COMMUNITY): Payer: Self-pay | Admitting: Adult Health

## 2016-09-25 ENCOUNTER — Encounter (HOSPITAL_COMMUNITY): Payer: Medicare Other | Attending: Oncology

## 2016-09-25 ENCOUNTER — Encounter (HOSPITAL_BASED_OUTPATIENT_CLINIC_OR_DEPARTMENT_OTHER): Payer: Medicare Other | Admitting: Adult Health

## 2016-09-25 VITALS — BP 93/70 | HR 130 | Temp 98.4°F | Resp 22 | Ht 72.0 in | Wt 159.0 lb

## 2016-09-25 DIAGNOSIS — D72820 Lymphocytosis (symptomatic): Secondary | ICD-10-CM

## 2016-09-25 DIAGNOSIS — F4323 Adjustment disorder with mixed anxiety and depressed mood: Secondary | ICD-10-CM

## 2016-09-25 DIAGNOSIS — D72829 Elevated white blood cell count, unspecified: Secondary | ICD-10-CM | POA: Diagnosis not present

## 2016-09-25 LAB — CBC WITH DIFFERENTIAL/PLATELET
Basophils Absolute: 0 10*3/uL (ref 0.0–0.1)
Basophils Relative: 0 %
EOS ABS: 0.5 10*3/uL (ref 0.0–0.7)
Eosinophils Relative: 4 %
HCT: 41 % (ref 39.0–52.0)
HEMOGLOBIN: 13.5 g/dL (ref 13.0–17.0)
Lymphocytes Relative: 25 %
Lymphs Abs: 2.9 10*3/uL (ref 0.7–4.0)
MCH: 34.5 pg — AB (ref 26.0–34.0)
MCHC: 32.9 g/dL (ref 30.0–36.0)
MCV: 104.9 fL — ABNORMAL HIGH (ref 78.0–100.0)
Monocytes Absolute: 0.8 10*3/uL (ref 0.1–1.0)
Monocytes Relative: 7 %
NEUTROS ABS: 7.3 10*3/uL (ref 1.7–7.7)
NEUTROS PCT: 64 %
Platelets: 319 10*3/uL (ref 150–400)
RBC: 3.91 MIL/uL — AB (ref 4.22–5.81)
RDW: 14.2 % (ref 11.5–15.5)
WBC: 11.5 10*3/uL — AB (ref 4.0–10.5)

## 2016-09-25 NOTE — Patient Instructions (Signed)
[facility] at [facility] Discharge Instructions  RECOMMENDATIONS MADE BY THE CONSULTANT AND ANY TEST RESULTS WILL BE SENT TO YOUR REFERRING PHYSICIAN.  You were seen today by Gretchen Dawson NP.   Thank you for choosing [facility] at [facility] to provide your oncology and hematology care.  To afford each patient quality time with our provider, please arrive at least 15 minutes before your scheduled appointment time.    If you have a lab appointment with the Cancer Center please come in thru the  Main Entrance and check in at the main information desk  You need to re-schedule your appointment should you arrive 10 or more minutes late.  We strive to give you quality time with our providers, and arriving late affects you and other patients whose appointments are after yours.  Also, if you no show three or more times for appointments you may be dismissed from the clinic at the providers discretion.     Again, thank you for choosing [facility].  Our hope is that these requests will decrease the amount of time that you wait before being seen by our physicians.       _____________________________________________________________  Should you have questions after your visit to [facility], please contact our office at [phone] between the hours of 8:30 a.m. and 4:30 p.m.  Voicemails left after 4:30 p.m. will not be returned until the following business day.  For prescription refill requests, have your pharmacy contact our office.       Resources For Cancer Patients and their Caregivers ? American Cancer Society: Can assist with transportation, wigs, general needs, runs Look Good Feel Better.        1-888-227-6333 ? Cancer Care: Provides financial assistance, online support groups, medication/co-pay assistance.  1-800-813-HOPE (4673) ? [facility] Assists [place] cancer patients and  their families through emotional , educational and financial support.  336-427-4357 ? [place] DSS Where to apply for food stamps, Medicaid and utility assistance. 336-342-1394 ? RCATS: Transportation to medical appointments. 336-347-2287 ? Social Security Administration: May apply for disability if have a Stage IV cancer. 336-342-7796 1-800-772-1213 ? [place] Aging, Disability and Transit Services: Assists with nutrition, care and transit needs. 336-349-2343  Cancer Center Support Programs: @10RELATIVEDAYS@ > Cancer Support Group  2nd Tuesday of the month 1pm-2pm, Journey Room  > Creative Journey  3rd Tuesday of the month 1130am-1pm, Journey Room  > Look Good Feel Better  1st Wednesday of the month 10am-12 noon, Journey Room (Call American Cancer Society to register 1-800-395-5775)    

## 2016-09-25 NOTE — Progress Notes (Signed)
[facility] [address], [place] [address]   CLINIC:  Medical Oncology/Hematology  PCP:  Brian Found, MD [address] [place] [address] [phone]   REASON FOR VISIT:  Follow-up for Lymphocytosis   CURRENT THERAPY: Observation    HISTORY OF PRESENT ILLNESS:  (From Brian Seashore, PA-C's last note on 12/09/15)  Brian Rich 61 y.o. male returns for followup of Leukocytosis with negative work-up for primary bone marrow disorder including BCR/ABL, [id number] with reflex to JAK2/CALR/MPL.      INTERVAL HISTORY:  [name] 61 y.o. male returns for routine follow-up for lymphocytosis.   Overall, she tells me that he has been feeling "fair."  He has chronic issues that continue to be concerns for him including right leg pain from previous injury, N&V, constipation, leg swelling, anxiety/depression, dizziness, sleep problems, and shortness of breath with exertion.   Endorses occasional night sweats, usually 1-3 times per week per his report.  He thinks this may be related to his warm sleeping environment on some of those episodes.  Denies fever that he is aware of, "but I thought I might be having fever when I'm sweating", but he has not actually checked his temperature.  Endorses occasional blood with wiping after a bowel movement, "but only when I'm constipated and have strained." Denies dark/tarry stools.  Denies hematuria, nosebleeds, or gingival bleeding.  He reports frustration with chronic N&V and dry heaves "when I get too hot. If the sun hits the top of my head, I will throw up."  He asks for a trash can during our visit because he felt nauseated.  States that he has medication for nausea, but cannot recall the name.   He feels fatigued and weak; states that he generally spends all day in bed "because I don't want to bother anybody."  He spends quite a bit of time talking to me about recent incident where somebody said something about him  on Facebook that hurt his feelings because it was referencing how he has treated his wife who is being treated for cancer per his report.  He said that he feels anxious at times "and they've decreased my nerve pills."  Reports that chronic leg pain plays a big role in his mood "and they've decreased my pain pills too."   He continues to wear (R) LE leg brace and walks with a cane (both are chronic issues and largely unchanged).    Denies smoking, but does use chewing tobacco; 1 can generally lasts about 1 week per his report.    REVIEW OF SYSTEMS:  Review of Systems - Oncology, per HPI  A 12-point ROS performed and is negative except as noted above.    PAST MEDICAL/SURGICAL HISTORY:  Past Medical History:  Diagnosis Date  . Chronic pain   . COPD (chronic obstructive pulmonary disease) (HCC)   . Elevated WBC count   . Hypertension   . Leukocytosis 08/18/2015   Past Surgical History:  Procedure Laterality Date  . right broken leg    . rod in place to right leg       SOCIAL HISTORY:  Social History   Social History  . Marital status: Married    Spouse name: N/A  . Number of children: N/A  . Years of education: N/A   Occupational History  . Not on file.   Social History Main Topics  . Smoking status: Former Games developer  . Smokeless tobacco: Current User  . Alcohol use No  .  Drug use: No  . Sexual activity: Not on file   Other Topics Concern  . Not on file   Social History Narrative  . No narrative on file    FAMILY HISTORY:  Family History  Problem Relation Age of Onset  . Diabetes Mother   . Cancer Father   . Heart failure Father   . Diabetes Sister     CURRENT MEDICATIONS:  Outpatient Encounter Prescriptions as of 09/25/2016  Medication Sig Note  . ALPRAZolam (XANAX) 1 MG tablet  07/29/2015: Received from: External Pharmacy  . aspirin 81 MG tablet Take 81 mg by mouth daily.   . clotrimazole-betamethasone (LOTRISONE) cream  07/29/2015: Received from: External  Pharmacy  . docusate sodium (COLACE) 100 MG capsule Take 100 mg by mouth 2 (two) times daily.   Marland Kitchen gabapentin (NEURONTIN) 300 MG capsule  07/29/2015: Received from: External Pharmacy  . levothyroxine (SYNTHROID, LEVOTHROID) 50 MCG tablet  12/09/2015: Received from: External Pharmacy  . losartan-hydrochlorothiazide (HYZAAR) 50-12.5 MG tablet  07/29/2015: Received from: External Pharmacy  . morphine (MS CONTIN) 30 MG 12 hr tablet  07/29/2015: Received from: External Pharmacy  . NEXIUM 40 MG capsule  07/29/2015: Received from: External Pharmacy  . oxyCODONE-acetaminophen (PERCOCET) 10-325 MG tablet  07/29/2015: Received from: External Pharmacy  . potassium chloride SA (K-DUR,KLOR-CON) 20 MEQ tablet TAKE 1 TABLET BY MOUTH TWICE DAILY   . simvastatin (ZOCOR) 10 MG tablet  07/29/2015: Received from: External Pharmacy  . venlafaxine (EFFEXOR) 75 MG tablet  07/29/2015: Received from: External Pharmacy   No facility-administered encounter medications on file as of 09/25/2016.     ALLERGIES:  Not on File   PHYSICAL EXAM:  ECOG Performance status: 1-2 - Symptomatic; may require occasional assistance   Vitals:   09/25/16 1051  BP: 93/70  Pulse: (!) 130  Resp: (!) 22  Temp: 98.4 F (36.9 C)  SpO2: 98%   Filed Weights   09/25/16 1051  Weight: 159 lb (72.1 kg)    Physical Exam  Constitutional: He is oriented to person, place, and time.  Chronically-ill appearing male in no acute distress   HENT:  Head: Normocephalic.  Mouth/Throat: Oropharynx is clear and moist. No oropharyngeal exudate.  Edentulous   Eyes: Pupils are equal, round, and reactive to light. Conjunctivae are normal. No scleral icterus.  Neck: Normal range of motion. Neck supple.  Cardiovascular: Normal rate and regular rhythm.   Pulmonary/Chest: Effort normal and breath sounds normal. No respiratory distress. He has no wheezes.  Abdominal: Soft. Bowel sounds are normal. There is no tenderness.  Musculoskeletal: He exhibits no  edema.  (R) lower leg brace in place; ambulates with cane   Lymphadenopathy:    He has no cervical adenopathy.       Right: No supraclavicular adenopathy present.       Left: No supraclavicular adenopathy present.  Neurological: He is alert and oriented to person, place, and time.  Skin: Skin is warm and dry. No rash noted.  Psychiatric: Memory and judgment normal. His mood appears anxious.  Slightly flat affect with some restlessness  Nursing note and vitals reviewed.    LABORATORY DATA:  I have reviewed the labs as listed.  CBC    Component Value Date/Time   WBC 11.5 (H) 09/25/2016 1009   RBC 3.91 (L) 09/25/2016 1009   HGB 13.5 09/25/2016 1009   HCT 41.0 09/25/2016 1009   PLT 319 09/25/2016 1009   MCV 104.9 (H) 09/25/2016 1009   MCH 34.5 (H) 09/25/2016 1009  MCHC 32.9 09/25/2016 1009   RDW 14.2 09/25/2016 1009   LYMPHSABS 2.9 09/25/2016 1009   MONOABS 0.8 09/25/2016 1009   EOSABS 0.5 09/25/2016 1009   BASOSABS 0.0 09/25/2016 1009   CMP Latest Ref Rng & Units 12/09/2015 07/29/2015 09/03/2008  Glucose 65 - 99 mg/dL - 960(A) 99  BUN 6 - 20 mg/dL - 10 14  Creatinine 5.40 - 1.24 mg/dL - 9.81 1.91  Sodium 478 - 145 mmol/L - 134(L) 139  Potassium 3.5 - 5.1 mmol/L 3.4(L) 3.1(L) 3.8  Chloride 101 - 111 mmol/L - 96(L) 100  CO2 22 - 32 mmol/L - 29 32  Calcium 8.9 - 10.3 mg/dL - 8.6(L) 8.9  Total Protein 6.5 - 8.1 g/dL - 7.4 6.1  Total Bilirubin 0.3 - 1.2 mg/dL - 0.5 0.8  Alkaline Phos 38 - 126 U/L - 79 75  AST 15 - 41 U/L - 18 29  ALT 17 - 63 U/L - 17 38              PENDING LABS:    DIAGNOSTIC IMAGING:    PATHOLOGY:  Peripheral blood flow cytometry: 08/02/15         ASSESSMENT & PLAN:   Lymphocytosis:  -WBCs improved from previous.  WBC 11.5 today with completely normal differential (no lymphocytosis, neutrophilia, or monocytosis as noted on previous lab work).   -JAK2, CALR, and BCR-ABL have all been negative. No monoclonal B-cells or abnormal  T-cells seen on peripheral flow cytometry completed in 12/2015.   -I have low clinical suspicion that his mildly elevated WBC count is secondary to infection or malignancy. However, we will continue to monitor.  Recommended he return in 6 months with labs. He agreed with this plan.   Adjustment disorder with anxious and depressed mood:  -Shared my concerns with patient that much of his behaviors and feelings may be secondary to clinical depression with anxiety.  He denies any suicidal or homicidal ideation.  I think he could benefit from SSRI/SNRI therapy. SNRI therapy may be a good option for his chronic pain as well.  However, since we only see the patient periodically at the cancer center, I would prefer his PCP manage these concerns going forward. I provided support today through active listening, validation of his concerns, and offered possible solutions for him to consider.  Recommended he follow-up with his PCP regarding these concerns; may want to consider referral to psychiatry for additional as well, if patient is willing.       Dispo:  -Return to cancer center in 6 months with labs.    All questions were answered to patient's stated satisfaction. Encouraged patient to call with any new concerns or questions before his next visit to the cancer center and we can certain see him sooner, if needed.    Plan of care discussed with Dr. Janyth Contes, who agrees with the above aforementioned.    A total of 30 minutes was spent in face-to-face care of this patient, with greater than 50% of that time spent in counseling and care-coordination.    Orders placed this encounter:  No orders of the defined types were placed in this encounter.     Lubertha Basque, NP [facility] [phone]

## 2017-03-21 ENCOUNTER — Other Ambulatory Visit (HOSPITAL_COMMUNITY): Payer: Self-pay

## 2017-03-21 DIAGNOSIS — D72829 Elevated white blood cell count, unspecified: Secondary | ICD-10-CM

## 2017-03-25 ENCOUNTER — Encounter (HOSPITAL_COMMUNITY): Payer: Self-pay | Admitting: Internal Medicine

## 2017-03-25 ENCOUNTER — Inpatient Hospital Stay (HOSPITAL_COMMUNITY): Payer: Medicare Other | Attending: Internal Medicine

## 2017-03-25 ENCOUNTER — Other Ambulatory Visit: Payer: Self-pay

## 2017-03-25 ENCOUNTER — Inpatient Hospital Stay (HOSPITAL_BASED_OUTPATIENT_CLINIC_OR_DEPARTMENT_OTHER): Payer: Medicare Other | Admitting: Internal Medicine

## 2017-03-25 VITALS — BP 115/84 | HR 117 | Temp 98.1°F | Resp 20 | Wt 169.7 lb

## 2017-03-25 DIAGNOSIS — D72820 Lymphocytosis (symptomatic): Secondary | ICD-10-CM | POA: Diagnosis not present

## 2017-03-25 DIAGNOSIS — D72829 Elevated white blood cell count, unspecified: Secondary | ICD-10-CM

## 2017-03-25 DIAGNOSIS — F419 Anxiety disorder, unspecified: Secondary | ICD-10-CM | POA: Insufficient documentation

## 2017-03-25 DIAGNOSIS — I10 Essential (primary) hypertension: Secondary | ICD-10-CM

## 2017-03-25 LAB — CBC WITH DIFFERENTIAL/PLATELET
Basophils Absolute: 0 10*3/uL (ref 0.0–0.1)
Basophils Relative: 0 %
EOS ABS: 0.4 10*3/uL (ref 0.0–0.7)
Eosinophils Relative: 3 %
HCT: 44.7 % (ref 39.0–52.0)
HEMOGLOBIN: 14.4 g/dL (ref 13.0–17.0)
LYMPHS ABS: 4.5 10*3/uL — AB (ref 0.7–4.0)
Lymphocytes Relative: 36 %
MCH: 30.4 pg (ref 26.0–34.0)
MCHC: 32.2 g/dL (ref 30.0–36.0)
MCV: 94.3 fL (ref 78.0–100.0)
MONO ABS: 1.1 10*3/uL — AB (ref 0.1–1.0)
MONOS PCT: 9 %
NEUTROS PCT: 52 %
Neutro Abs: 6.4 10*3/uL (ref 1.7–7.7)
Platelets: 342 10*3/uL (ref 150–400)
RBC: 4.74 MIL/uL (ref 4.22–5.81)
RDW: 12.8 % (ref 11.5–15.5)
WBC: 12.4 10*3/uL — ABNORMAL HIGH (ref 4.0–10.5)

## 2017-03-25 NOTE — Progress Notes (Signed)
Diagnosis No diagnosis found.  Staging Cancer Staging No matching staging information was found for the patient.  Assessment and Plan:  1.  Lymphocytosis.  62 y.o. male returns for followup of Leukocytosis with negative work-up for primary bone marrow disorder including BCR/ABL, [id number] with reflex to JAK2/CALR/MPL.  He was previously followed by Dr. Galen Rich.  Labs done 03/25/2017 shows WBC 12.4 HB 14.4 and plts 342,000.  I suspect this is a normal varient.  He will RTC in 6 months for follow-up and repeat labs.  He should notify the office if he has any problems prior to his next visit.    2.  Hypertension.  BP is 115/84.  He should continue to follow-up with PCP.    3.  Anxiety/Depression.  Continue to follow-up with PCP or psychiatry  for management  INTERVAL HISTORY:  Brian Rich 62 y.o. male returns for routine follow-up for lymphocytosis.    Current Status:  Pt is seen today for follow-up.  He is here to go over lab results.   Problem List Patient Active Problem List   Diagnosis Date Noted  . Leukocytosis [D72.829] 08/18/2015  . Anxiety [F41.9] 12/10/2010  . Chronic pain associated with significant psychosocial dysfunction [G89.4] 12/10/2010  . Reactive depression [F32.9] 12/10/2010    Past Medical History Past Medical History:  Diagnosis Date  . Chronic pain   . COPD (chronic obstructive pulmonary disease) (HCC)   . Elevated WBC count   . Hypertension   . Leukocytosis 08/18/2015    Past Surgical History Past Surgical History:  Procedure Laterality Date  . right broken leg    . rod in place to right leg      Family History Family History  Problem Relation Age of Onset  . Diabetes Mother   . Cancer Father   . Heart failure Father   . Diabetes Sister      Social History  reports that he has quit smoking. He uses smokeless tobacco. He reports that he does not drink alcohol or use drugs.  Medications  Current Outpatient Medications:  .  ALPRAZolam  (XANAX) 1 MG tablet, , Disp: , Rfl:  .  aspirin 81 MG tablet, Take 81 mg by mouth daily., Disp: , Rfl:  .  clotrimazole-betamethasone (LOTRISONE) cream, , Disp: , Rfl:  .  docusate sodium (COLACE) 100 MG capsule, Take 100 mg by mouth 2 (two) times daily., Disp: , Rfl:  .  gabapentin (NEURONTIN) 300 MG capsule, , Disp: , Rfl:  .  levothyroxine (SYNTHROID, LEVOTHROID) 50 MCG tablet, , Disp: , Rfl:  .  losartan-hydrochlorothiazide (HYZAAR) 50-12.5 MG tablet, , Disp: , Rfl:  .  morphine (MS CONTIN) 30 MG 12 hr tablet, , Disp: , Rfl:  .  NEXIUM 40 MG capsule, , Disp: , Rfl:  .  oxyCODONE-acetaminophen (PERCOCET) 10-325 MG tablet, , Disp: , Rfl:  .  potassium chloride SA (K-DUR,KLOR-CON) 20 MEQ tablet, TAKE 1 TABLET BY MOUTH TWICE DAILY, Disp: 60 tablet, Rfl: 0 .  simvastatin (ZOCOR) 10 MG tablet, , Disp: , Rfl:  .  venlafaxine (EFFEXOR) 75 MG tablet, , Disp: , Rfl:   Allergies Patient has no allergy information on record.  Review of Systems Review of Systems - Oncology ROS as per HPI otherwise 12 point ROS is negative.   Physical Exam  Vitals Wt Readings from Last 3 Encounters:  03/25/17 169 lb 11.2 oz (77 kg)  09/25/16 159 lb (72.1 kg)  12/09/15 178 lb (80.7 kg)   Temp Readings from  Last 3 Encounters:  03/25/17 98.1 F (36.7 C) (Oral)  09/25/16 98.4 F (36.9 C) (Oral)  12/09/15 97.7 F (36.5 C) (Oral)   BP Readings from Last 3 Encounters:  03/25/17 115/84  09/25/16 93/70  12/09/15 (!) 86/67   Pulse Readings from Last 3 Encounters:  03/25/17 (!) 117  09/25/16 (!) 130  12/09/15 (!) 125   Constitutional: Well-developed, well-nourished, and in no distress.   HENT: Head: Normocephalic and atraumatic.  Mouth/Throat: No oropharyngeal exudate. Mucosa moist. Eyes: Pupils are equal, round, and reactive to light. Conjunctivae are normal. No scleral icterus.  Neck: Normal range of motion. Neck supple. No JVD present.  Cardiovascular: Normal rate, regular rhythm and normal heart  sounds.  Exam reveals no gallop and no friction rub.   No murmur heard. Pulmonary/Chest: Effort normal and breath sounds normal. No respiratory distress. No wheezes.No rales.  Abdominal: Soft. Bowel sounds are normal. No distension. There is no tenderness. There is no guarding.  Musculoskeletal: No edema or tenderness.  Lymphadenopathy: No cervical, axillary or supraclavicular adenopathy.  Neurological: Alert and oriented to person, place, and time. No cranial nerve deficit.  Skin: Skin is warm and dry. No rash noted. No erythema. No pallor.  Psychiatric: Affect and judgment normal.   Labs Appointment on 03/25/2017  Component Date Value Ref Range Status  . WBC 03/25/2017 12.4* 4.0 - 10.5 K/uL Final  . RBC 03/25/2017 4.74  4.22 - 5.81 MIL/uL Final  . Hemoglobin 03/25/2017 14.4  13.0 - 17.0 g/dL Final  . HCT 16/10/9602 44.7  39.0 - 52.0 % Final  . MCV 03/25/2017 94.3  78.0 - 100.0 fL Final  . MCH 03/25/2017 30.4  26.0 - 34.0 pg Final  . MCHC 03/25/2017 32.2  30.0 - 36.0 g/dL Final  . RDW 54/09/8117 12.8  11.5 - 15.5 % Final  . Platelets 03/25/2017 342  150 - 400 K/uL Final  . Neutrophils Relative % 03/25/2017 52  % Final  . Neutro Abs 03/25/2017 6.4  1.7 - 7.7 K/uL Final  . Lymphocytes Relative 03/25/2017 36  % Final  . Lymphs Abs 03/25/2017 4.5* 0.7 - 4.0 K/uL Final  . Monocytes Relative 03/25/2017 9  % Final  . Monocytes Absolute 03/25/2017 1.1* 0.1 - 1.0 K/uL Final  . Eosinophils Relative 03/25/2017 3  % Final  . Eosinophils Absolute 03/25/2017 0.4  0.0 - 0.7 K/uL Final  . Basophils Relative 03/25/2017 0  % Final  . Basophils Absolute 03/25/2017 0.0  0.0 - 0.1 K/uL Final   Performed at [facility], [address] [place], [place] [address]         Ahmed Prima MD

## 2017-03-25 NOTE — Patient Instructions (Addendum)
[facility] at [facility] Discharge Instructions  You were seen today by Dr. Melton AlarHiggs. She went over your recent lab results and how we would repeat your labs in about 6 months. She discussed how you've been feeling and how you are currently feeling. We will see you back in 6 months for labs and follow up. If you start having any fever, night sweats, any new lumps or bumps, please call and let us know and we will get you in before your follow up.      Thank you for choosing [facility] at [facility] to provide your oncology and hematology care.  To afford each patient quality time with our provider, please arrive at least 15 minutes before your scheduled appointment time.   If you have a lab appointment with the Cancer Center please come in thru the  Main Entrance and check in at the main information desk  You need to re-schedule your appointment should you arrive 10 or more minutes late.  We strive to give you quality time with our providers, and arriving late affects you and other patients whose appointments are after yours.  Also, if you no show three or more times for appointments you may be dismissed from the clinic at the providers discretion.     Again, thank you for choosing [facility].  Our hope is that these requests will decrease the amount of time that you wait before being seen by our physicians.       _____________________________________________________________  Should you have questions after your visit to [facility], please contact our office at [phone] between the hours of 8:30 a.m. and 4:30 p.m.  Voicemails left after 4:30 p.m. will not be returned until the following business day.  For prescription refill requests, have your pharmacy contact our office.       Resources For Cancer Patients and their Caregivers ? American Cancer Society: Can assist with transportation, wigs, general needs, runs  Look Good Feel Better.        [phone] ? Cancer Care: Provides financial assistance, online support groups, medication/co-pay assistance.  1-800-813-HOPE 231-196-2968(4673) ? [facility] Assists [place] cancer patients and their families through emotional , educational and financial support.  [phone] ? [place] DSS Where to apply for food stamps, Medicaid and utility assistance. [phone] ? [employer]: Transportation to medical appointments. [phone] ? Social Security Administration: May apply for disability if have a Stage IV cancer. [phone] ? [place] [facility], Dis[facility] and Transit Services: Assists with nutrition, care and transit needs. [phone]  Cancer Center Support Programs:   > Cancer Support Group  2nd Tuesday of the month 1pm-2pm, Journey Room   > Creative Journey  3rd Tuesday of the month 1130am-1pm, Journey Room

## 2017-09-16 ENCOUNTER — Other Ambulatory Visit (HOSPITAL_COMMUNITY): Payer: Self-pay

## 2017-09-16 DIAGNOSIS — D72829 Elevated white blood cell count, unspecified: Secondary | ICD-10-CM

## 2017-09-16 DIAGNOSIS — D72820 Lymphocytosis (symptomatic): Secondary | ICD-10-CM

## 2017-09-17 ENCOUNTER — Inpatient Hospital Stay (HOSPITAL_COMMUNITY): Payer: Medicare Other | Attending: Internal Medicine

## 2017-09-17 DIAGNOSIS — Z87891 Personal history of nicotine dependence: Secondary | ICD-10-CM | POA: Insufficient documentation

## 2017-09-17 DIAGNOSIS — D72820 Lymphocytosis (symptomatic): Secondary | ICD-10-CM | POA: Diagnosis not present

## 2017-09-17 DIAGNOSIS — F418 Other specified anxiety disorders: Secondary | ICD-10-CM | POA: Diagnosis not present

## 2017-09-17 DIAGNOSIS — I10 Essential (primary) hypertension: Secondary | ICD-10-CM | POA: Diagnosis not present

## 2017-09-17 DIAGNOSIS — D72829 Elevated white blood cell count, unspecified: Secondary | ICD-10-CM

## 2017-09-17 LAB — CBC WITH DIFFERENTIAL/PLATELET
Basophils Absolute: 0.1 10*3/uL (ref 0.0–0.1)
Basophils Relative: 1 %
EOS ABS: 0.5 10*3/uL (ref 0.0–0.7)
EOS PCT: 7 %
HCT: 43.1 % (ref 39.0–52.0)
HEMOGLOBIN: 13.7 g/dL (ref 13.0–17.0)
LYMPHS ABS: 2.9 10*3/uL (ref 0.7–4.0)
LYMPHS PCT: 38 %
MCH: 30.4 pg (ref 26.0–34.0)
MCHC: 31.8 g/dL (ref 30.0–36.0)
MCV: 95.8 fL (ref 78.0–100.0)
Monocytes Absolute: 0.6 10*3/uL (ref 0.1–1.0)
Monocytes Relative: 7 %
NEUTROS PCT: 47 %
Neutro Abs: 3.6 10*3/uL (ref 1.7–7.7)
PLATELETS: 253 10*3/uL (ref 150–400)
RBC: 4.5 MIL/uL (ref 4.22–5.81)
RDW: 12.4 % (ref 11.5–15.5)
WBC: 7.5 10*3/uL (ref 4.0–10.5)

## 2017-09-24 ENCOUNTER — Encounter (HOSPITAL_COMMUNITY): Payer: Self-pay | Admitting: Internal Medicine

## 2017-09-24 ENCOUNTER — Ambulatory Visit (HOSPITAL_COMMUNITY): Payer: Medicare Other | Admitting: Adult Health

## 2017-09-24 ENCOUNTER — Inpatient Hospital Stay (HOSPITAL_COMMUNITY): Payer: Medicare Other | Admitting: Internal Medicine

## 2017-09-24 VITALS — BP 132/91 | HR 101 | Temp 98.5°F | Resp 18 | Wt 170.6 lb

## 2017-09-24 DIAGNOSIS — F418 Other specified anxiety disorders: Secondary | ICD-10-CM

## 2017-09-24 DIAGNOSIS — Z87891 Personal history of nicotine dependence: Secondary | ICD-10-CM | POA: Diagnosis not present

## 2017-09-24 DIAGNOSIS — I10 Essential (primary) hypertension: Secondary | ICD-10-CM

## 2017-09-24 DIAGNOSIS — D72820 Lymphocytosis (symptomatic): Secondary | ICD-10-CM

## 2017-09-24 NOTE — Patient Instructions (Signed)
[facility] at [facility] Discharge Instructions   You were seen today by Dr. Ahmed PrimaVetta Higgs. She went over your recent lab results. Your labs are good. We will see you back in 6 for labs and follow up in 1 year.   Thank you for choosing [facility] at [facility] to provide your oncology and hematology care.  To afford each patient quality time with our provider, please arrive at least 15 minutes before your scheduled appointment time.    If you have a lab appointment with the Cancer Center please come in thru the  Main Entrance and check in at the main information desk  You need to re-schedule your appointment should you arrive 10 or more minutes late.  We strive to give you quality time with our providers, and arriving late affects you and other patients whose appointments are after yours.  Also, if you no show three or more times for appointments you may be dismissed from the clinic at the providers discretion.     Again, thank you for choosing [facility].  Our hope is that these requests will decrease the amount of time that you wait before being seen by our physicians.       _____________________________________________________________  Should you have questions after your visit to [facility], please contact our office at [phone] between the hours of 8:30 a.m. and 4:30 p.m.  Voicemails left after 4:30 p.m. will not be returned until the following business day.  For prescription refill requests, have your pharmacy contact our office.       Resources For Cancer Patients and their Caregivers ? American Cancer Society: Can assist with transportation, wigs, general needs, runs Look Good Feel Better.        [phone] ? Cancer Care: Provides financial assistance, online support groups, medication/co-pay assistance.  1-800-813-HOPE (929)511-5524(4673) ? [facility] Assists [place] cancer  patients and their families through emotional , educational and financial support.  [phone] [phone] ? [place] DSS Where to apply for food stamps, Medicaid and utility assistance. [phone] [phone] [phone] ? [employer]: Transportation to medical appointments. [phone] ? Social Security Administration: May apply for disability if have a Stage IV cancer. [phone] [phone] ? [place] [facility], [facility] [employer] [facility]: Assists with nutrition, care and transit needs. [phone]  Cancer Center Support Programs:   > Cancer Support Group  2nd Tuesday of the month 1pm-2pm, Journey Room   > Creative Journey  3rd Tuesday of the month 1130am-1pm, Journey Room

## 2017-09-24 NOTE — Progress Notes (Signed)
Diagnosis Lymphocytosis - Plan: CBC with Differential/Platelet, Comprehensive metabolic panel, Lactate dehydrogenase, CBC with Differential/Platelet, Comprehensive metabolic panel, Lactate dehydrogenase  Staging Cancer Staging No matching staging information was found for the patient.  Assessment and Plan:  1.  Lymphocytosis.  62 y.o. male returns for followup of leukocytosis with negative work-up for primary bone marrow disorder including BCR/ABL, JAK2V617F with reflex to JAK2/CALR/MPL.  He was previously followed by Dr. Galen [place].    Labs done 09/17/2017 reviewed and showed WBC 7.5 HB 13.7 and plts 253,000.  PT will have labs done in 03/2018 for follow-up and will RTC in 09/2017 for follow-up and repeat labs.  He is informed WBC WNL on labs done 09/17/2017.    2.  Hypertension.  BP is  132/91.  Follow-up with PCP.    3.  Anxiety/Depression.  Follow-up with PCP or psychiatry  for management   Current Status:  Pt is seen today for follow-up. He is here to go over labs.    Problem List Patient Active Problem List   Diagnosis Date Noted  . Leukocytosis [D72.829] 08/18/2015  . Anxiety [F41.9] 12/10/2010  . Chronic pain associated with significant psychosocial dysfunction [G89.4] 12/10/2010  . Reactive depression [F32.9] 12/10/2010    Past Medical History Past Medical History:  Diagnosis Date  . Chronic pain   . COPD (chronic obstructive pulmonary disease) (HCC)   . Elevated WBC count   . Hypertension   . Leukocytosis 08/18/2015    Past Surgical History Past Surgical History:  Procedure Laterality Date  . right broken leg    . rod in place to right leg      Family History Family History  Problem Relation Age of Onset  . Diabetes Mother   . Cancer Father   . Heart failure Father   . Diabetes Sister      Social History  reports that he has quit smoking. He uses smokeless tobacco. He reports that he does not drink alcohol or use drugs.  Medications  Current Outpatient  Medications:  .  ALPRAZolam (XANAX) 1 MG tablet, , Disp: , Rfl:  .  aspirin 81 MG tablet, Take 81 mg by mouth daily., Disp: , Rfl:  .  clotrimazole-betamethasone (LOTRISONE) cream, , Disp: , Rfl:  .  docusate sodium (COLACE) 100 MG capsule, Take 100 mg by mouth 2 (two) times daily., Disp: , Rfl:  .  gabapentin (NEURONTIN) 300 MG capsule, , Disp: , Rfl:  .  levothyroxine (SYNTHROID, LEVOTHROID) 50 MCG tablet, , Disp: , Rfl:  .  losartan-hydrochlorothiazide (HYZAAR) 50-12.5 MG tablet, , Disp: , Rfl:  .  morphine (MS CONTIN) 30 MG 12 hr tablet, , Disp: , Rfl:  .  NEXIUM 40 MG capsule, , Disp: , Rfl:  .  oxyCODONE-acetaminophen (PERCOCET) 10-325 MG tablet, , Disp: , Rfl:  .  potassium chloride SA (K-DUR,KLOR-CON) 20 MEQ tablet, TAKE 1 TABLET BY MOUTH TWICE DAILY, Disp: 60 tablet, Rfl: 0 .  simvastatin (ZOCOR) 10 MG tablet, , Disp: , Rfl:  .  venlafaxine (EFFEXOR) 75 MG tablet, , Disp: , Rfl:   Allergies Patient has no allergy information on record.  Review of Systems Review of Systems - Oncology ROS negative    Physical Exam  Vitals Wt Readings from Last 3 Encounters:  09/24/17 170 lb 9.6 oz (77.4 kg)  03/25/17 169 lb 11.2 oz (77 kg)  09/25/16 159 lb (72.1 kg)   Temp Readings from Last 3 Encounters:  09/24/17 98.5 F (36.9 C) (Oral)  03/25/17 98.1  F (36.7 C) (Oral)  09/25/16 98.4 F (36.9 C) (Oral)   BP Readings from Last 3 Encounters:  09/24/17 (!) 132/91  03/25/17 115/84  09/25/16 93/70   Pulse Readings from Last 3 Encounters:  09/24/17 (!) 101  03/25/17 (!) 117  09/25/16 (!) 130   Constitutional: Well-developed, well-nourished, and in no distress.   HENT: Head: Normocephalic and atraumatic.  Mouth/Throat: No oropharyngeal exudate. Mucosa moist. Eyes: Pupils are equal, round, and reactive to light. Conjunctivae are normal. No scleral icterus.  Neck: Normal range of motion. Neck supple. No JVD present.  Cardiovascular: Normal rate, regular rhythm and normal heart  sounds.  Exam reveals no gallop and no friction rub.   No murmur heard. Pulmonary/Chest: Effort normal and breath sounds normal. No respiratory distress. No wheezes.No rales.  Abdominal: Soft. Bowel sounds are normal. No distension. There is no tenderness. There is no guarding.  Musculoskeletal: No edema or tenderness.  Lymphadenopathy: No cervical, axillary or supraclavicular adenopathy.  Neurological: Alert and oriented to person, place, and time. No cranial nerve deficit.  Skin: Skin is warm and dry. No rash noted. No erythema. No pallor.  Psychiatric: Affect and judgment normal.   Labs No visits with results within 3 Day(s) from this visit.  Latest known visit with results is:  Appointment on 09/17/2017  Component Date Value Ref Range Status  . WBC 09/17/2017 7.5  4.0 - 10.5 K/uL Final  . RBC 09/17/2017 4.50  4.22 - 5.81 MIL/uL Final  . Hemoglobin 09/17/2017 13.7  13.0 - 17.0 g/dL Final  . HCT 40/98/1191 43.1  39.0 - 52.0 % Final  . MCV 09/17/2017 95.8  78.0 - 100.0 fL Final  . MCH 09/17/2017 30.4  26.0 - 34.0 pg Final  . MCHC 09/17/2017 31.8  30.0 - 36.0 g/dL Final  . RDW 47/82/9562 12.4  11.5 - 15.5 % Final  . Platelets 09/17/2017 253  150 - 400 K/uL Final  . Neutrophils Relative % 09/17/2017 47  % Final  . Neutro Abs 09/17/2017 3.6  1.7 - 7.7 K/uL Final  . Lymphocytes Relative 09/17/2017 38  % Final  . Lymphs Abs 09/17/2017 2.9  0.7 - 4.0 K/uL Final  . Monocytes Relative 09/17/2017 7  % Final  . Monocytes Absolute 09/17/2017 0.6  0.1 - 1.0 K/uL Final  . Eosinophils Relative 09/17/2017 7  % Final  . Eosinophils Absolute 09/17/2017 0.5  0.0 - 0.7 K/uL Final  . Basophils Relative 09/17/2017 1  % Final  . Basophils Absolute 09/17/2017 0.1  0.0 - 0.1 K/uL Final   Performed at [facility], [address] [place], [place] [address]     Pathology Orders Placed This Encounter  Procedures  . CBC with Differential/Platelet    Standing Status:   Future    Standing Expiration  Date:   09/25/2019  . Comprehensive metabolic panel    Standing Status:   Future    Standing Expiration Date:   09/25/2019  . Lactate dehydrogenase    Standing Status:   Future    Standing Expiration Date:   09/25/2019  . CBC with Differential/Platelet    Standing Status:   Future    Standing Expiration Date:   09/25/2019  . Comprehensive metabolic panel    Standing Status:   Future    Standing Expiration Date:   09/25/2019  . Lactate dehydrogenase    Standing Status:   Future    Standing Expiration Date:   09/25/2019       Minta Balsam Mason Dibiasio  MD

## 2018-03-25 ENCOUNTER — Other Ambulatory Visit (HOSPITAL_COMMUNITY): Payer: Medicare Other

## 2018-09-18 ENCOUNTER — Other Ambulatory Visit (HOSPITAL_COMMUNITY): Payer: Medicare Other

## 2018-09-25 ENCOUNTER — Ambulatory Visit (HOSPITAL_COMMUNITY): Payer: Medicare Other | Admitting: Hematology

## 2019-01-22 DIAGNOSIS — M76891 Other specified enthesopathies of right lower limb, excluding foot: Secondary | ICD-10-CM | POA: Diagnosis not present

## 2019-01-22 DIAGNOSIS — G894 Chronic pain syndrome: Secondary | ICD-10-CM | POA: Diagnosis not present

## 2019-01-22 DIAGNOSIS — S8411XS Injury of peroneal nerve at lower leg level, right leg, sequela: Secondary | ICD-10-CM | POA: Diagnosis not present

## 2019-01-22 DIAGNOSIS — Z79891 Long term (current) use of opiate analgesic: Secondary | ICD-10-CM | POA: Diagnosis not present

## 2019-01-30 DIAGNOSIS — Z23 Encounter for immunization: Secondary | ICD-10-CM | POA: Diagnosis not present

## 2019-03-02 DIAGNOSIS — Z1389 Encounter for screening for other disorder: Secondary | ICD-10-CM | POA: Diagnosis not present

## 2019-03-02 DIAGNOSIS — Z0001 Encounter for general adult medical examination with abnormal findings: Secondary | ICD-10-CM | POA: Diagnosis not present

## 2019-03-11 DIAGNOSIS — Z6822 Body mass index (BMI) 22.0-22.9, adult: Secondary | ICD-10-CM | POA: Diagnosis not present

## 2019-03-11 DIAGNOSIS — J449 Chronic obstructive pulmonary disease, unspecified: Secondary | ICD-10-CM | POA: Diagnosis not present

## 2019-03-11 DIAGNOSIS — E785 Hyperlipidemia, unspecified: Secondary | ICD-10-CM | POA: Diagnosis not present

## 2019-03-11 DIAGNOSIS — K219 Gastro-esophageal reflux disease without esophagitis: Secondary | ICD-10-CM | POA: Diagnosis not present

## 2019-03-11 DIAGNOSIS — R7309 Other abnormal glucose: Secondary | ICD-10-CM | POA: Diagnosis not present

## 2019-03-11 DIAGNOSIS — F329 Major depressive disorder, single episode, unspecified: Secondary | ICD-10-CM | POA: Diagnosis not present

## 2019-03-11 DIAGNOSIS — F419 Anxiety disorder, unspecified: Secondary | ICD-10-CM | POA: Diagnosis not present

## 2019-03-11 DIAGNOSIS — I10 Essential (primary) hypertension: Secondary | ICD-10-CM | POA: Diagnosis not present

## 2019-03-11 DIAGNOSIS — G894 Chronic pain syndrome: Secondary | ICD-10-CM | POA: Diagnosis not present

## 2019-03-19 DIAGNOSIS — Z79891 Long term (current) use of opiate analgesic: Secondary | ICD-10-CM | POA: Diagnosis not present

## 2019-03-19 DIAGNOSIS — M76891 Other specified enthesopathies of right lower limb, excluding foot: Secondary | ICD-10-CM | POA: Diagnosis not present

## 2019-03-19 DIAGNOSIS — G894 Chronic pain syndrome: Secondary | ICD-10-CM | POA: Diagnosis not present

## 2019-03-19 DIAGNOSIS — S8411XS Injury of peroneal nerve at lower leg level, right leg, sequela: Secondary | ICD-10-CM | POA: Diagnosis not present

## 2019-05-14 DIAGNOSIS — M76891 Other specified enthesopathies of right lower limb, excluding foot: Secondary | ICD-10-CM | POA: Diagnosis not present

## 2019-05-14 DIAGNOSIS — Z79891 Long term (current) use of opiate analgesic: Secondary | ICD-10-CM | POA: Diagnosis not present

## 2019-05-14 DIAGNOSIS — G894 Chronic pain syndrome: Secondary | ICD-10-CM | POA: Diagnosis not present

## 2019-05-14 DIAGNOSIS — S8411XS Injury of peroneal nerve at lower leg level, right leg, sequela: Secondary | ICD-10-CM | POA: Diagnosis not present

## 2019-06-10 DIAGNOSIS — J019 Acute sinusitis, unspecified: Secondary | ICD-10-CM | POA: Diagnosis not present

## 2019-07-09 DIAGNOSIS — G894 Chronic pain syndrome: Secondary | ICD-10-CM | POA: Diagnosis not present

## 2019-07-09 DIAGNOSIS — Z79891 Long term (current) use of opiate analgesic: Secondary | ICD-10-CM | POA: Diagnosis not present

## 2019-07-09 DIAGNOSIS — S8411XS Injury of peroneal nerve at lower leg level, right leg, sequela: Secondary | ICD-10-CM | POA: Diagnosis not present

## 2019-07-09 DIAGNOSIS — M76891 Other specified enthesopathies of right lower limb, excluding foot: Secondary | ICD-10-CM | POA: Diagnosis not present

## 2019-09-02 DIAGNOSIS — M76891 Other specified enthesopathies of right lower limb, excluding foot: Secondary | ICD-10-CM | POA: Diagnosis not present

## 2019-09-02 DIAGNOSIS — S8411XS Injury of peroneal nerve at lower leg level, right leg, sequela: Secondary | ICD-10-CM | POA: Diagnosis not present

## 2019-09-02 DIAGNOSIS — Z79891 Long term (current) use of opiate analgesic: Secondary | ICD-10-CM | POA: Diagnosis not present

## 2019-09-02 DIAGNOSIS — G894 Chronic pain syndrome: Secondary | ICD-10-CM | POA: Diagnosis not present

## 2019-10-28 DIAGNOSIS — G894 Chronic pain syndrome: Secondary | ICD-10-CM | POA: Diagnosis not present

## 2019-10-28 DIAGNOSIS — Z79891 Long term (current) use of opiate analgesic: Secondary | ICD-10-CM | POA: Diagnosis not present

## 2019-10-28 DIAGNOSIS — S8411XS Injury of peroneal nerve at lower leg level, right leg, sequela: Secondary | ICD-10-CM | POA: Diagnosis not present

## 2019-10-28 DIAGNOSIS — M76891 Other specified enthesopathies of right lower limb, excluding foot: Secondary | ICD-10-CM | POA: Diagnosis not present

## 2019-12-29 DIAGNOSIS — S8411XS Injury of peroneal nerve at lower leg level, right leg, sequela: Secondary | ICD-10-CM | POA: Diagnosis not present

## 2019-12-29 DIAGNOSIS — Z79891 Long term (current) use of opiate analgesic: Secondary | ICD-10-CM | POA: Diagnosis not present

## 2019-12-29 DIAGNOSIS — G894 Chronic pain syndrome: Secondary | ICD-10-CM | POA: Diagnosis not present

## 2019-12-29 DIAGNOSIS — M76891 Other specified enthesopathies of right lower limb, excluding foot: Secondary | ICD-10-CM | POA: Diagnosis not present

## 2020-02-23 DIAGNOSIS — S8411XS Injury of peroneal nerve at lower leg level, right leg, sequela: Secondary | ICD-10-CM | POA: Diagnosis not present

## 2020-02-23 DIAGNOSIS — M76891 Other specified enthesopathies of right lower limb, excluding foot: Secondary | ICD-10-CM | POA: Diagnosis not present

## 2020-02-23 DIAGNOSIS — Z79891 Long term (current) use of opiate analgesic: Secondary | ICD-10-CM | POA: Diagnosis not present

## 2020-02-23 DIAGNOSIS — G894 Chronic pain syndrome: Secondary | ICD-10-CM | POA: Diagnosis not present

## 2020-04-19 DIAGNOSIS — G894 Chronic pain syndrome: Secondary | ICD-10-CM | POA: Diagnosis not present

## 2020-04-19 DIAGNOSIS — Z79891 Long term (current) use of opiate analgesic: Secondary | ICD-10-CM | POA: Diagnosis not present

## 2020-04-19 DIAGNOSIS — S8411XS Injury of peroneal nerve at lower leg level, right leg, sequela: Secondary | ICD-10-CM | POA: Diagnosis not present

## 2020-04-19 DIAGNOSIS — M76891 Other specified enthesopathies of right lower limb, excluding foot: Secondary | ICD-10-CM | POA: Diagnosis not present

## 2020-06-14 DIAGNOSIS — G894 Chronic pain syndrome: Secondary | ICD-10-CM | POA: Diagnosis not present

## 2020-06-14 DIAGNOSIS — Z79891 Long term (current) use of opiate analgesic: Secondary | ICD-10-CM | POA: Diagnosis not present

## 2020-06-14 DIAGNOSIS — M76891 Other specified enthesopathies of right lower limb, excluding foot: Secondary | ICD-10-CM | POA: Diagnosis not present

## 2020-06-14 DIAGNOSIS — S8411XS Injury of peroneal nerve at lower leg level, right leg, sequela: Secondary | ICD-10-CM | POA: Diagnosis not present

## 2020-08-10 DIAGNOSIS — S8411XS Injury of peroneal nerve at lower leg level, right leg, sequela: Secondary | ICD-10-CM | POA: Diagnosis not present

## 2020-08-10 DIAGNOSIS — Z79891 Long term (current) use of opiate analgesic: Secondary | ICD-10-CM | POA: Diagnosis not present

## 2020-08-10 DIAGNOSIS — M76891 Other specified enthesopathies of right lower limb, excluding foot: Secondary | ICD-10-CM | POA: Diagnosis not present

## 2020-08-10 DIAGNOSIS — G894 Chronic pain syndrome: Secondary | ICD-10-CM | POA: Diagnosis not present

## 2020-09-22 DIAGNOSIS — E782 Mixed hyperlipidemia: Secondary | ICD-10-CM | POA: Diagnosis not present

## 2020-09-22 DIAGNOSIS — Z125 Encounter for screening for malignant neoplasm of prostate: Secondary | ICD-10-CM | POA: Diagnosis not present

## 2020-09-22 DIAGNOSIS — J449 Chronic obstructive pulmonary disease, unspecified: Secondary | ICD-10-CM | POA: Diagnosis not present

## 2020-09-22 DIAGNOSIS — F419 Anxiety disorder, unspecified: Secondary | ICD-10-CM | POA: Diagnosis not present

## 2020-09-22 DIAGNOSIS — Z1389 Encounter for screening for other disorder: Secondary | ICD-10-CM | POA: Diagnosis not present

## 2020-09-22 DIAGNOSIS — E785 Hyperlipidemia, unspecified: Secondary | ICD-10-CM | POA: Diagnosis not present

## 2020-09-22 DIAGNOSIS — E039 Hypothyroidism, unspecified: Secondary | ICD-10-CM | POA: Diagnosis not present

## 2020-09-22 DIAGNOSIS — Z23 Encounter for immunization: Secondary | ICD-10-CM | POA: Diagnosis not present

## 2020-09-22 DIAGNOSIS — R7309 Other abnormal glucose: Secondary | ICD-10-CM | POA: Diagnosis not present

## 2020-09-22 DIAGNOSIS — Z0001 Encounter for general adult medical examination with abnormal findings: Secondary | ICD-10-CM | POA: Diagnosis not present

## 2020-09-22 DIAGNOSIS — I10 Essential (primary) hypertension: Secondary | ICD-10-CM | POA: Diagnosis not present

## 2020-09-22 DIAGNOSIS — Z6822 Body mass index (BMI) 22.0-22.9, adult: Secondary | ICD-10-CM | POA: Diagnosis not present

## 2020-10-06 DIAGNOSIS — M76891 Other specified enthesopathies of right lower limb, excluding foot: Secondary | ICD-10-CM | POA: Diagnosis not present

## 2020-10-06 DIAGNOSIS — G894 Chronic pain syndrome: Secondary | ICD-10-CM | POA: Diagnosis not present

## 2020-10-06 DIAGNOSIS — Z79891 Long term (current) use of opiate analgesic: Secondary | ICD-10-CM | POA: Diagnosis not present

## 2020-10-06 DIAGNOSIS — S8411XS Injury of peroneal nerve at lower leg level, right leg, sequela: Secondary | ICD-10-CM | POA: Diagnosis not present

## 2020-12-08 DIAGNOSIS — M76891 Other specified enthesopathies of right lower limb, excluding foot: Secondary | ICD-10-CM | POA: Diagnosis not present

## 2020-12-08 DIAGNOSIS — S8411XS Injury of peroneal nerve at lower leg level, right leg, sequela: Secondary | ICD-10-CM | POA: Diagnosis not present

## 2020-12-08 DIAGNOSIS — G894 Chronic pain syndrome: Secondary | ICD-10-CM | POA: Diagnosis not present

## 2020-12-08 DIAGNOSIS — Z79891 Long term (current) use of opiate analgesic: Secondary | ICD-10-CM | POA: Diagnosis not present

## 2021-02-09 DIAGNOSIS — M76891 Other specified enthesopathies of right lower limb, excluding foot: Secondary | ICD-10-CM | POA: Diagnosis not present

## 2021-02-09 DIAGNOSIS — Z79891 Long term (current) use of opiate analgesic: Secondary | ICD-10-CM | POA: Diagnosis not present

## 2021-02-09 DIAGNOSIS — S8411XS Injury of peroneal nerve at lower leg level, right leg, sequela: Secondary | ICD-10-CM | POA: Diagnosis not present

## 2021-02-09 DIAGNOSIS — G894 Chronic pain syndrome: Secondary | ICD-10-CM | POA: Diagnosis not present

## 2021-04-13 DIAGNOSIS — Z79891 Long term (current) use of opiate analgesic: Secondary | ICD-10-CM | POA: Diagnosis not present

## 2021-04-13 DIAGNOSIS — G894 Chronic pain syndrome: Secondary | ICD-10-CM | POA: Diagnosis not present

## 2021-04-13 DIAGNOSIS — M76891 Other specified enthesopathies of right lower limb, excluding foot: Secondary | ICD-10-CM | POA: Diagnosis not present

## 2021-04-13 DIAGNOSIS — S8411XS Injury of peroneal nerve at lower leg level, right leg, sequela: Secondary | ICD-10-CM | POA: Diagnosis not present

## 2021-06-08 DIAGNOSIS — S8411XS Injury of peroneal nerve at lower leg level, right leg, sequela: Secondary | ICD-10-CM | POA: Diagnosis not present

## 2021-06-08 DIAGNOSIS — Z79891 Long term (current) use of opiate analgesic: Secondary | ICD-10-CM | POA: Diagnosis not present

## 2021-06-08 DIAGNOSIS — G894 Chronic pain syndrome: Secondary | ICD-10-CM | POA: Diagnosis not present

## 2021-06-08 DIAGNOSIS — M76891 Other specified enthesopathies of right lower limb, excluding foot: Secondary | ICD-10-CM | POA: Diagnosis not present

## 2021-08-03 DIAGNOSIS — Z79891 Long term (current) use of opiate analgesic: Secondary | ICD-10-CM | POA: Diagnosis not present

## 2021-08-03 DIAGNOSIS — S8411XS Injury of peroneal nerve at lower leg level, right leg, sequela: Secondary | ICD-10-CM | POA: Diagnosis not present

## 2021-08-03 DIAGNOSIS — G894 Chronic pain syndrome: Secondary | ICD-10-CM | POA: Diagnosis not present

## 2021-08-03 DIAGNOSIS — M76891 Other specified enthesopathies of right lower limb, excluding foot: Secondary | ICD-10-CM | POA: Diagnosis not present

## 2021-09-28 DIAGNOSIS — M76891 Other specified enthesopathies of right lower limb, excluding foot: Secondary | ICD-10-CM | POA: Diagnosis not present

## 2021-09-28 DIAGNOSIS — G894 Chronic pain syndrome: Secondary | ICD-10-CM | POA: Diagnosis not present

## 2021-09-28 DIAGNOSIS — S8411XS Injury of peroneal nerve at lower leg level, right leg, sequela: Secondary | ICD-10-CM | POA: Diagnosis not present

## 2021-09-28 DIAGNOSIS — Z79891 Long term (current) use of opiate analgesic: Secondary | ICD-10-CM | POA: Diagnosis not present

## 2021-10-03 DIAGNOSIS — G894 Chronic pain syndrome: Secondary | ICD-10-CM | POA: Diagnosis not present

## 2021-10-03 DIAGNOSIS — K219 Gastro-esophageal reflux disease without esophagitis: Secondary | ICD-10-CM | POA: Diagnosis not present

## 2021-10-03 DIAGNOSIS — R7309 Other abnormal glucose: Secondary | ICD-10-CM | POA: Diagnosis not present

## 2021-10-03 DIAGNOSIS — Z0001 Encounter for general adult medical examination with abnormal findings: Secondary | ICD-10-CM | POA: Diagnosis not present

## 2021-10-03 DIAGNOSIS — J449 Chronic obstructive pulmonary disease, unspecified: Secondary | ICD-10-CM | POA: Diagnosis not present

## 2021-10-03 DIAGNOSIS — E039 Hypothyroidism, unspecified: Secondary | ICD-10-CM | POA: Diagnosis not present

## 2021-10-03 DIAGNOSIS — I10 Essential (primary) hypertension: Secondary | ICD-10-CM | POA: Diagnosis not present

## 2021-10-03 DIAGNOSIS — E785 Hyperlipidemia, unspecified: Secondary | ICD-10-CM | POA: Diagnosis not present

## 2021-10-03 DIAGNOSIS — F419 Anxiety disorder, unspecified: Secondary | ICD-10-CM | POA: Diagnosis not present

## 2021-11-23 DIAGNOSIS — S8411XS Injury of peroneal nerve at lower leg level, right leg, sequela: Secondary | ICD-10-CM | POA: Diagnosis not present

## 2021-11-23 DIAGNOSIS — Z79891 Long term (current) use of opiate analgesic: Secondary | ICD-10-CM | POA: Diagnosis not present

## 2021-11-23 DIAGNOSIS — G894 Chronic pain syndrome: Secondary | ICD-10-CM | POA: Diagnosis not present

## 2021-11-23 DIAGNOSIS — M76891 Other specified enthesopathies of right lower limb, excluding foot: Secondary | ICD-10-CM | POA: Diagnosis not present

## 2022-01-18 DIAGNOSIS — S8411XS Injury of peroneal nerve at lower leg level, right leg, sequela: Secondary | ICD-10-CM | POA: Diagnosis not present

## 2022-01-18 DIAGNOSIS — Z79891 Long term (current) use of opiate analgesic: Secondary | ICD-10-CM | POA: Diagnosis not present

## 2022-01-18 DIAGNOSIS — G894 Chronic pain syndrome: Secondary | ICD-10-CM | POA: Diagnosis not present

## 2022-01-18 DIAGNOSIS — M76891 Other specified enthesopathies of right lower limb, excluding foot: Secondary | ICD-10-CM | POA: Diagnosis not present

## 2022-03-15 DIAGNOSIS — S8411XS Injury of peroneal nerve at lower leg level, right leg, sequela: Secondary | ICD-10-CM | POA: Diagnosis not present

## 2022-03-15 DIAGNOSIS — M76891 Other specified enthesopathies of right lower limb, excluding foot: Secondary | ICD-10-CM | POA: Diagnosis not present

## 2022-03-15 DIAGNOSIS — Z79891 Long term (current) use of opiate analgesic: Secondary | ICD-10-CM | POA: Diagnosis not present

## 2022-03-15 DIAGNOSIS — G894 Chronic pain syndrome: Secondary | ICD-10-CM | POA: Diagnosis not present

## 2022-05-14 DIAGNOSIS — G894 Chronic pain syndrome: Secondary | ICD-10-CM | POA: Diagnosis not present

## 2022-05-14 DIAGNOSIS — Z79891 Long term (current) use of opiate analgesic: Secondary | ICD-10-CM | POA: Diagnosis not present

## 2022-05-14 DIAGNOSIS — M76891 Other specified enthesopathies of right lower limb, excluding foot: Secondary | ICD-10-CM | POA: Diagnosis not present

## 2022-05-14 DIAGNOSIS — S8411XS Injury of peroneal nerve at lower leg level, right leg, sequela: Secondary | ICD-10-CM | POA: Diagnosis not present

## 2022-05-15 DIAGNOSIS — G894 Chronic pain syndrome: Secondary | ICD-10-CM | POA: Diagnosis not present

## 2022-05-15 DIAGNOSIS — Z79891 Long term (current) use of opiate analgesic: Secondary | ICD-10-CM | POA: Diagnosis not present

## 2022-07-11 DIAGNOSIS — G894 Chronic pain syndrome: Secondary | ICD-10-CM | POA: Diagnosis not present

## 2022-07-11 DIAGNOSIS — Z79891 Long term (current) use of opiate analgesic: Secondary | ICD-10-CM | POA: Diagnosis not present

## 2022-07-11 DIAGNOSIS — M76891 Other specified enthesopathies of right lower limb, excluding foot: Secondary | ICD-10-CM | POA: Diagnosis not present

## 2022-07-11 DIAGNOSIS — S8411XS Injury of peroneal nerve at lower leg level, right leg, sequela: Secondary | ICD-10-CM | POA: Diagnosis not present

## 2022-09-11 DIAGNOSIS — G894 Chronic pain syndrome: Secondary | ICD-10-CM | POA: Diagnosis not present

## 2022-09-11 DIAGNOSIS — S8411XS Injury of peroneal nerve at lower leg level, right leg, sequela: Secondary | ICD-10-CM | POA: Diagnosis not present

## 2022-09-11 DIAGNOSIS — M76891 Other specified enthesopathies of right lower limb, excluding foot: Secondary | ICD-10-CM | POA: Diagnosis not present

## 2022-09-11 DIAGNOSIS — Z79891 Long term (current) use of opiate analgesic: Secondary | ICD-10-CM | POA: Diagnosis not present

## 2022-10-05 DIAGNOSIS — E039 Hypothyroidism, unspecified: Secondary | ICD-10-CM | POA: Diagnosis not present

## 2022-10-05 DIAGNOSIS — J449 Chronic obstructive pulmonary disease, unspecified: Secondary | ICD-10-CM | POA: Diagnosis not present

## 2022-10-05 DIAGNOSIS — Z0001 Encounter for general adult medical examination with abnormal findings: Secondary | ICD-10-CM | POA: Diagnosis not present

## 2022-10-05 DIAGNOSIS — F419 Anxiety disorder, unspecified: Secondary | ICD-10-CM | POA: Diagnosis not present

## 2022-10-05 DIAGNOSIS — Z23 Encounter for immunization: Secondary | ICD-10-CM | POA: Diagnosis not present

## 2022-10-05 DIAGNOSIS — Z6821 Body mass index (BMI) 21.0-21.9, adult: Secondary | ICD-10-CM | POA: Diagnosis not present

## 2022-10-05 DIAGNOSIS — E785 Hyperlipidemia, unspecified: Secondary | ICD-10-CM | POA: Diagnosis not present

## 2022-10-05 DIAGNOSIS — K219 Gastro-esophageal reflux disease without esophagitis: Secondary | ICD-10-CM | POA: Diagnosis not present

## 2022-10-05 DIAGNOSIS — G894 Chronic pain syndrome: Secondary | ICD-10-CM | POA: Diagnosis not present

## 2022-10-05 DIAGNOSIS — Z1331 Encounter for screening for depression: Secondary | ICD-10-CM | POA: Diagnosis not present

## 2022-10-05 DIAGNOSIS — I10 Essential (primary) hypertension: Secondary | ICD-10-CM | POA: Diagnosis not present

## 2022-11-08 DIAGNOSIS — M76891 Other specified enthesopathies of right lower limb, excluding foot: Secondary | ICD-10-CM | POA: Diagnosis not present

## 2022-11-08 DIAGNOSIS — G894 Chronic pain syndrome: Secondary | ICD-10-CM | POA: Diagnosis not present

## 2022-11-08 DIAGNOSIS — Z79891 Long term (current) use of opiate analgesic: Secondary | ICD-10-CM | POA: Diagnosis not present

## 2022-11-08 DIAGNOSIS — S8411XS Injury of peroneal nerve at lower leg level, right leg, sequela: Secondary | ICD-10-CM | POA: Diagnosis not present

## 2023-01-07 DIAGNOSIS — S8411XS Injury of peroneal nerve at lower leg level, right leg, sequela: Secondary | ICD-10-CM | POA: Diagnosis not present

## 2023-01-07 DIAGNOSIS — Z79891 Long term (current) use of opiate analgesic: Secondary | ICD-10-CM | POA: Diagnosis not present

## 2023-01-07 DIAGNOSIS — G894 Chronic pain syndrome: Secondary | ICD-10-CM | POA: Diagnosis not present

## 2023-01-07 DIAGNOSIS — M76891 Other specified enthesopathies of right lower limb, excluding foot: Secondary | ICD-10-CM | POA: Diagnosis not present

## 2023-03-06 DIAGNOSIS — S8411XS Injury of peroneal nerve at lower leg level, right leg, sequela: Secondary | ICD-10-CM | POA: Diagnosis not present

## 2023-03-06 DIAGNOSIS — Z79891 Long term (current) use of opiate analgesic: Secondary | ICD-10-CM | POA: Diagnosis not present

## 2023-03-06 DIAGNOSIS — G894 Chronic pain syndrome: Secondary | ICD-10-CM | POA: Diagnosis not present

## 2023-03-06 DIAGNOSIS — M76891 Other specified enthesopathies of right lower limb, excluding foot: Secondary | ICD-10-CM | POA: Diagnosis not present

## 2023-04-30 DIAGNOSIS — M76891 Other specified enthesopathies of right lower limb, excluding foot: Secondary | ICD-10-CM | POA: Diagnosis not present

## 2023-04-30 DIAGNOSIS — G894 Chronic pain syndrome: Secondary | ICD-10-CM | POA: Diagnosis not present

## 2023-04-30 DIAGNOSIS — Z79891 Long term (current) use of opiate analgesic: Secondary | ICD-10-CM | POA: Diagnosis not present

## 2023-04-30 DIAGNOSIS — S8411XS Injury of peroneal nerve at lower leg level, right leg, sequela: Secondary | ICD-10-CM | POA: Diagnosis not present

## 2023-06-25 DIAGNOSIS — S8411XS Injury of peroneal nerve at lower leg level, right leg, sequela: Secondary | ICD-10-CM | POA: Diagnosis not present

## 2023-06-25 DIAGNOSIS — M76891 Other specified enthesopathies of right lower limb, excluding foot: Secondary | ICD-10-CM | POA: Diagnosis not present

## 2023-06-25 DIAGNOSIS — Z79891 Long term (current) use of opiate analgesic: Secondary | ICD-10-CM | POA: Diagnosis not present

## 2023-06-25 DIAGNOSIS — G894 Chronic pain syndrome: Secondary | ICD-10-CM | POA: Diagnosis not present

## 2023-08-20 DIAGNOSIS — G894 Chronic pain syndrome: Secondary | ICD-10-CM | POA: Diagnosis not present

## 2023-08-20 DIAGNOSIS — S8411XS Injury of peroneal nerve at lower leg level, right leg, sequela: Secondary | ICD-10-CM | POA: Diagnosis not present

## 2023-08-20 DIAGNOSIS — M76891 Other specified enthesopathies of right lower limb, excluding foot: Secondary | ICD-10-CM | POA: Diagnosis not present

## 2023-08-20 DIAGNOSIS — Z79891 Long term (current) use of opiate analgesic: Secondary | ICD-10-CM | POA: Diagnosis not present
